# Patient Record
Sex: Female | Born: 1950
Health system: Southern US, Community
[De-identification: ages and names within clinical notes are randomized; demographics above are authoritative.]

## PROBLEM LIST (undated history)

## (undated) DIAGNOSIS — I10 Essential (primary) hypertension: Secondary | ICD-10-CM

## (undated) DIAGNOSIS — Z972 Presence of dental prosthetic device (complete) (partial): Secondary | ICD-10-CM

## (undated) DIAGNOSIS — M25473 Effusion, unspecified ankle: Secondary | ICD-10-CM

## (undated) HISTORY — DX: Effusion, unspecified ankle: M25.473

## (undated) HISTORY — DX: Essential (primary) hypertension: I10

## (undated) HISTORY — PX: ABDOMINAL HYSTERECTOMY: SHX81

## (undated) HISTORY — PX: TUBAL LIGATION: SHX77

---

## 2006-10-14 ENCOUNTER — Other Ambulatory Visit: Payer: Self-pay

## 2006-10-14 ENCOUNTER — Emergency Department: Payer: Self-pay

## 2007-12-29 ENCOUNTER — Emergency Department: Payer: Self-pay

## 2007-12-29 ENCOUNTER — Other Ambulatory Visit: Payer: Self-pay

## 2009-07-23 ENCOUNTER — Emergency Department: Payer: Self-pay | Admitting: Internal Medicine

## 2009-11-14 ENCOUNTER — Ambulatory Visit: Payer: Self-pay

## 2011-03-04 ENCOUNTER — Ambulatory Visit: Payer: Self-pay

## 2011-12-18 ENCOUNTER — Emergency Department: Payer: Self-pay | Admitting: Emergency Medicine

## 2012-05-11 ENCOUNTER — Emergency Department: Payer: Self-pay | Admitting: Emergency Medicine

## 2012-05-11 LAB — CBC
HCT: 40.1 % (ref 35.0–47.0)
HGB: 13.1 g/dL (ref 12.0–16.0)
MCH: 27.1 pg (ref 26.0–34.0)
MCHC: 32.6 g/dL (ref 32.0–36.0)
MCV: 83 fL (ref 80–100)
RBC: 4.82 10*6/uL (ref 3.80–5.20)
WBC: 6.3 10*3/uL (ref 3.6–11.0)

## 2012-05-11 LAB — BASIC METABOLIC PANEL
BUN: 13 mg/dL (ref 7–18)
Calcium, Total: 9.1 mg/dL (ref 8.5–10.1)
Chloride: 105 mmol/L (ref 98–107)
EGFR (Non-African Amer.): >60
Glucose: 80 mg/dL (ref 65–99)
Osmolality: 278 (ref 275–301)
Sodium: 140 mmol/L (ref 136–145)

## 2012-05-12 ENCOUNTER — Ambulatory Visit: Payer: Self-pay

## 2013-08-24 ENCOUNTER — Ambulatory Visit: Payer: Self-pay

## 2013-12-08 ENCOUNTER — Emergency Department: Payer: Self-pay | Admitting: Emergency Medicine

## 2014-11-23 ENCOUNTER — Ambulatory Visit: Payer: Self-pay | Admitting: Family Medicine

## 2014-12-22 ENCOUNTER — Other Ambulatory Visit: Payer: Self-pay | Admitting: Family Medicine

## 2014-12-25 ENCOUNTER — Telehealth: Payer: Self-pay | Admitting: Family Medicine

## 2014-12-25 NOTE — Telephone Encounter (Signed)
PT IS NEEDING REFILL ON MEDS. SHE IS OUT

## 2014-12-26 MED ORDER — LISINOPRIL-HYDROCHLOROTHIAZIDE 20-25 MG PO TABS: 1.0000 | ORAL_TABLET | Freq: Every day | ORAL | Status: DC

## 2014-12-26 NOTE — Telephone Encounter (Signed)
I sent 1 month of medication to pharmacy in pt chart. Pt has not been seen since 05/24/2014 and must make appointment for any further refills.

## 2014-12-26 NOTE — Telephone Encounter (Signed)
Patient informed and appointment made for 01-11-15

## 2015-01-11 ENCOUNTER — Ambulatory Visit: Payer: Self-pay | Admitting: Family Medicine

## 2015-03-01 ENCOUNTER — Other Ambulatory Visit: Payer: Self-pay | Admitting: Family Medicine

## 2015-04-26 ENCOUNTER — Other Ambulatory Visit: Payer: Self-pay | Admitting: Family Medicine

## 2015-05-31 ENCOUNTER — Other Ambulatory Visit: Payer: Self-pay | Admitting: Family Medicine

## 2015-06-07 ENCOUNTER — Telehealth: Payer: Self-pay

## 2015-06-07 ENCOUNTER — Other Ambulatory Visit: Payer: Self-pay

## 2015-06-07 MED ORDER — LISINOPRIL-HYDROCHLOROTHIAZIDE 20-25 MG PO TABS: 1.0000 | ORAL_TABLET | Freq: Every day | ORAL | Status: DC

## 2015-06-07 NOTE — Telephone Encounter (Signed)
This is a [facility] patient and I told them abstract this chart and I scheduled after talking to you but is it only Acute? Let me know if I need to change this please.

## 2015-06-07 NOTE — Telephone Encounter (Signed)
Pt called wanting an appointment and med refills. I offered pt to see someone at [place] and she agreed. Made appt for 06/14/15@1 :30(w/Amy),pt aware. Went over how important it is to keep this appointment due to this office doing a favor for [place] pt verbalized agreement to keep appointment. Also sent refills for pt to pahrmacy.

## 2015-06-14 ENCOUNTER — Encounter: Payer: Self-pay | Admitting: *Deleted

## 2015-06-14 ENCOUNTER — Ambulatory Visit (INDEPENDENT_AMBULATORY_CARE_PROVIDER_SITE_OTHER): Payer: Self-pay | Admitting: Family Medicine

## 2015-06-14 ENCOUNTER — Telehealth: Payer: Self-pay

## 2015-06-14 VITALS — BP 136/69 | HR 66 | Temp 97.6°F | Resp 16 | Ht 62.0 in | Wt 172.0 lb

## 2015-06-14 DIAGNOSIS — I10 Essential (primary) hypertension: Secondary | ICD-10-CM

## 2015-06-14 DIAGNOSIS — R079 Chest pain, unspecified: Secondary | ICD-10-CM

## 2015-06-14 MED ORDER — LISINOPRIL-HYDROCHLOROTHIAZIDE 20-25 MG PO TABS: 1.0000 | ORAL_TABLET | Freq: Every day | ORAL | Status: DC

## 2015-06-14 NOTE — Assessment & Plan Note (Signed)
Renew BP medications. Check CMET. Continue ACE for renal protection. Follow-up with PCP.

## 2015-06-14 NOTE — Progress Notes (Signed)
Subjective:    Patient ID: Brittney Schmidt, female    DOB: 10-Oct-1950, 65 y.o.   MRN: [id number]  HPI: Brittney Schmidt is a 65 y.o. female presenting on 06/14/2015 for Hypertension   HPI   Patient of Dr. Rutherford Nail here for BP follow up. MD is on vacation and out of town until May. Received one refill on lisinopril/HCTZ, but was told she had to see somebody in the interim. Work at [employer] that has loud noises. Has been on same BP medication for years.   Gets dizzy at work and feels like she is going to pass out. Last saw eye doctor 2 years ago.   BP - Does not check at home. [facility], but does not remember what it was CP: related to lifting heavy things, doesn't last very long, does not take medication to help. No SOB. Has nausea with chest tightness sometimes. CP radiates to left arm. Last episode was 2-3 days ago. Has happened about 3 times in the last month. Family history of heart disease in mother.  Rt Ankle pain and swelling - resolved.  Health maintenance: Flu shot - she would like to get today DEXA - never Colonoscopy - never Mammogram - last year, was normal Papsmear - last year, was normal  Past Medical History  Diagnosis Date  . Hypertension   . Ankle swelling   . GERD (gastroesophageal reflux disease)     No current outpatient prescriptions on file prior to visit.   No current facility-administered medications on file prior to visit.    Review of Systems  Constitutional: Negative for fever, activity change and unexpected weight change.  HENT: Negative for hearing loss and trouble swallowing.   Eyes: Negative for pain and visual disturbance.  Respiratory: Positive for chest tightness. Negative for shortness of breath.   Cardiovascular: Positive for chest pain and leg swelling (ankle). Negative for palpitations.  Gastrointestinal: Positive for nausea. Negative for vomiting and abdominal pain.  Musculoskeletal: Positive for joint swelling.  Negative for myalgias, back pain and gait problem.  Skin: Negative for color change.  Allergic/Immunologic: Positive for environmental allergies.  Neurological: Positive for dizziness and headaches. Negative for syncope and light-headedness.  Psychiatric/Behavioral: Negative for behavioral problems and agitation.   Per HPI unless specifically indicated above     Objective:    BP 136/69 mmHg  Pulse 66  Temp(Src) 97.6 F (36.4 C) (Oral)  Resp 16  Ht 5' 2" (1.575 m)  Wt 172 lb (78.019 kg)  BMI 31.45 kg/m2  Wt Readings from Last 3 Encounters:  06/14/15 172 lb (78.019 kg)    Physical Exam  Constitutional: She is oriented to person, place, and time. She appears well-developed.  HENT:  Head: Normocephalic.  Eyes: Conjunctivae and EOM are normal. Pupils are equal, round, and reactive to light.  Neck: Normal range of motion. Carotid bruit is not present. No thyromegaly present.  Cardiovascular: Normal rate, regular rhythm, normal heart sounds and intact distal pulses.   Pulses:      Radial pulses are 2+ on the right side, and 2+ on the left side.       Dorsalis pedis pulses are 2+ on the right side, and 2+ on the left side.  Pulmonary/Chest: Effort normal and breath sounds normal. No respiratory distress.  Abdominal: Soft. Bowel sounds are normal. She exhibits no mass. There is no splenomegaly or hepatomegaly. There is no tenderness. There is no rebound and no guarding.  Musculoskeletal: Normal range  of motion. Edema: right ankle.  Neurological: She is alert and oriented to person, place, and time. She has normal strength. She displays normal reflexes. No cranial nerve deficit or sensory deficit. She displays a negative Romberg sign. Coordination normal. GCS eye subscore is 4. GCS verbal subscore is 5. GCS motor subscore is 6.  Skin: Skin is warm and dry. No erythema.  Psychiatric: She has a normal mood and affect. Her behavior is normal.   Results for orders placed or performed in  visit on 05/11/12  CBC  Result Value Ref Range   WBC 6.3 3.6-11.0 x10 3/mm 3   RBC 4.82 3.80-5.20 X10 6/mm 3   HGB 13.1 12.0-16.0 g/dL   HCT 40.1 35.0-47.0 %   MCV 83 80-100 fL   MCH 27.1 26.0-34.0 pg   MCHC 32.6 32.0-36.0 g/dL   RDW 13.3 11.5-14.5 %   Platelet 216 150-440 x10 3/mm 3  Basic metabolic panel  Result Value Ref Range   Glucose 80 65-99 mg/dL   BUN 13 7-18 mg/dL   Creatinine 0.84 0.60-1.30 mg/dL   Sodium 140 136-145 mmol/L   Potassium 3.4 (L) 3.5-5.1 mmol/L   Chloride 105 98-107 mmol/L   Co2 30 21-32 mmol/L   Calcium, Total 9.1 8.5-10.1 mg/dL   Osmolality 278 275-301   Anion Gap 5 (L) 7-16   EGFR (African American) >60    EGFR (Non-African Amer.) >60   Troponin I  Result Value Ref Range   Troponin-I < 0.02 ng/mL  Troponin I  Result Value Ref Range   Troponin-I < 0.02 ng/mL      Assessment & Plan:     Problem List Items Addressed This Visit      Cardiovascular and Mediastinum   Hypertension   Relevant Medications   aspirin 81 MG tablet   lisinopril-hydrochlorothiazide (PRINZIDE,ZESTORETIC) 20-25 MG tablet   Other Relevant Orders   Comprehensive metabolic panel    Other Visit Diagnoses    Chest pain on exertion    -  Primary    [place] MSK since it has only occurred with lifting. EKG unchanged from previous. however she may need cardiology consult given history. Have called PCP to ensure patient follows on chest pain within 2 weeks. so they can initiate a referral. Alarm symptoms reviewed. Pt to ER with further CP.     Relevant Orders    Lipid Profile    CBC with Differential/Platelet       Meds ordered this encounter  Medications  . aspirin 81 MG tablet    Sig: Take 81 mg by mouth daily.  Marland Kitchen ibuprofen (ADVIL,MOTRIN) 200 MG tablet    Sig: Take 200 mg by mouth every 6 (six) hours as needed.  . diphenhydrAMINE (ALLERGY) 25 mg capsule    Sig: Take 25 mg by mouth every 6 (six) hours as needed.  Marland Kitchen lisinopril-hydrochlorothiazide (PRINZIDE,ZESTORETIC)  20-25 MG tablet    Sig: Take 1 tablet by mouth daily.    Dispense:  30 tablet    Refill:  5    Order Specific Question:  Supervising Provider    Answer:  Arlis Porta [[id number]]      Follow up plan: Return in about 2 weeks (around 06/28/2015) for chest pain with [facility].Marland Kitchen

## 2015-06-14 NOTE — Patient Instructions (Signed)
We will talk with [facility] about your chest pain. I think it might be muscle related however, you can never be too careful.  Your doctors over there may want you to see cardiology.   IF you have continued chest pain, shortness of breath, nausea, palpitations or any concerning symptoms, please go directly to the ER.   Your goal blood pressure is 140/90. Work on low salt/sodium diet - goal <1.5gm (1,500mg ) per day. Eat a diet high in fruits/vegetables and whole grains.  Look into mediterranean and DASH diet. Goal activity is 133min/wk of moderate intensity exercise.  This can be split into 30 minute chunks.  If you are not at this level, you can start with smaller 10-15 min increments and slowly build up activity. Look at [email or web address]org for more resources.

## 2015-06-14 NOTE — Addendum Note (Signed)
Addended byVincenza Hews, Ariella Voit L on: 06/14/2015 05:28 PM   Modules accepted: Orders, SmartSet

## 2015-06-14 NOTE — Telephone Encounter (Signed)
NP called from [facility], they saw her today for f/u on HTN and she also noted she had chest pain.  They did EKG and everything was stable.  They want to make sure she followed up with Dr. Rutherford Nail.

## 2016-06-04 ENCOUNTER — Encounter: Payer: Self-pay | Admitting: Family Medicine

## 2016-06-04 ENCOUNTER — Ambulatory Visit (INDEPENDENT_AMBULATORY_CARE_PROVIDER_SITE_OTHER): Payer: Self-pay | Admitting: Family Medicine

## 2016-06-04 VITALS — BP 134/72 | HR 87 | Temp 97.6°F | Resp 16 | Ht 62.0 in | Wt 173.0 lb

## 2016-06-04 DIAGNOSIS — E669 Obesity, unspecified: Secondary | ICD-10-CM

## 2016-06-04 DIAGNOSIS — I10 Essential (primary) hypertension: Secondary | ICD-10-CM

## 2016-06-04 DIAGNOSIS — Z7689 Persons encountering health services in other specified circumstances: Secondary | ICD-10-CM

## 2016-06-04 MED ORDER — LISINOPRIL-HYDROCHLOROTHIAZIDE 20-25 MG PO TABS: 1.0000 | ORAL_TABLET | Freq: Every day | ORAL | 3 refills | Status: DC

## 2016-06-04 NOTE — Progress Notes (Signed)
Subjective:    Patient ID: Brittney Schmidt, female    DOB: 05/23/1950, 66 y.o.   MRN: [id number]  Brittney Schmidt is a 66 y.o. female presenting on 06/04/2016 for Establish Care and Hypertension  Last visit 05/2015 with Amy Krebs at our office, patient never returned to see Dr Rutherford Nail before they retired and now she is establishing as new patient here.  HPI   CHRONIC HTN: Reports prior diagnosis >10 years ago Current Meds - Lisinopril-HCTZ 20-24m daily (has not run out yet, has 2 pills) Reports good compliance, took meds today. Tolerating well, w/o complaints. - Taking OTC ASA 861m- No prior history of MI / CVA. Notable family history of heart disease MI Denies CP, dyspnea, HA, edema, dizziness / lightheadedness  Obesity BMI >31 - Reports no known history of hyperlipidemia, abnormal glucose or pre-diabetes/diabetes. - Admits eats a lot of sweets, will try to reduce this in future, does eat plenty of fruit and some vegetables, eats out often, frequent fast food. Does eat potatoes as frequent carb, has not reduced portion size or other dietary measures - Drinks mostly water, no sweet tea or soda. Does consume alcohol mostly beer 1-2 times a week  Additional social history: - Patient has no insurance currently, states she is self pay  Health Maintenance: [facility] normal and had prior pap smear screening in 2015 normal. Would like to resume [facility] screening  Past Medical History:  Diagnosis Date  . Ankle swelling   . GERD (gastroesophageal reflux disease)   . Hypertension    Past Surgical History:  Procedure Laterality Date  . TUBAL LIGATION     Social History   Social History  . Marital status: Single    Spouse name: N/A  . Number of children: 3  . Years of education: GED   Occupational History  . unemployed (previously worked as temp)    Social History Main Topics  . Smoking status: Never  Smoker  . Smokeless tobacco: Never Used  . Alcohol use 1.2 oz/week    2 Cans of beer per week     Comment: occasional, planning to quit completely  . Drug use: No  . Sexual activity: Not Currently    Partners: Male   Other Topics Concern  . Not on file   Social History Narrative  . No narrative on file   Family History  Problem Relation Age of Onset  . Diabetes Mother   . Hypertension Mother   . Colon polyps Father   . Heart disease Father   . Heart attack Father   . Cancer Brother   . Hypertension Son    Current Outpatient Prescriptions on File Prior to Visit  Medication Sig  . aspirin 81 MG tablet Take 81 mg by mouth daily.  . Marland Kitchenbuprofen (ADVIL,MOTRIN) 200 MG tablet Take 200 mg by mouth every 6 (six) hours as needed.   No current facility-administered medications on file prior to visit.     Review of Systems Per HPI unless specifically indicated above     Objective:    BP 134/72 (BP Location: Left Arm, Cuff Size: Normal)   Pulse 87   Temp 97.6 F (36.4 C) (Oral)   Resp 16   Ht _0  (1.575 m)   Wt 173 lb (78.5 kg)   BMI 31.64 kg/m   Wt Readings from Last 3 Encounters:  06/04/16 173 lb (78.5 kg)  06/14/15 172  lb (78 kg)    Physical Exam  Constitutional: She is oriented to person, place, and time. She appears well-developed and well-nourished. No distress.  Well-appearing, comfortable, cooperative  HENT:  Head: Normocephalic and atraumatic.  Mouth/Throat: Oropharynx is clear and moist.  Frontal / maxillary sinuses non-tender. Nares patent without purulence or edema. Bilateral TMs clear without erythema, effusion or bulging. Oropharynx clear without erythema, exudates, edema or asymmetry.  Eyes: Conjunctivae and EOM are normal. Pupils are equal, round, and reactive to light.  Neck: Normal range of motion. Neck supple. No thyromegaly present.  No carotid bruits  Cardiovascular: Normal rate, regular rhythm, normal heart sounds and intact distal pulses.   No  murmur heard. Pulmonary/Chest: Effort normal and breath sounds normal. No respiratory distress. She has no wheezes. She has no rales.  Abdominal: Soft. Bowel sounds are normal.  Musculoskeletal: Normal range of motion. She exhibits no edema or tenderness.  Upper / Lower Extremities: - Normal muscle tone, strength bilateral upper extremities 5/5, lower extremities 5/5  - Normal Gait  Lymphadenopathy:    She has no cervical adenopathy.  Neurological: She is alert and oriented to person, place, and time.  Skin: Skin is warm and dry. No rash noted. She is not diaphoretic.  Psychiatric: She has a normal mood and affect. Her behavior is normal.  Well groomed, good eye contact, normal speech and thoughts  Nursing note and vitals reviewed.  I have personally reviewed the following lab results from 05/11/12.  Results for orders placed or performed in visit on 05/11/12  CBC  Result Value Ref Range   WBC 6.3 3.6 - 11.0 x10 3/mm 3   RBC 4.82 3.80 - 5.20 X10 6/mm 3   HGB 13.1 12.0 - 16.0 g/dL   HCT 40.1 35.0 - 47.0 %   MCV 83 80 - 100 fL   MCH 27.1 26.0 - 34.0 pg   MCHC 32.6 32.0 - 36.0 g/dL   RDW 13.3 11.5 - 14.5 %   Platelet 216 150 - 440 x10 3/mm 3  Basic metabolic panel  Result Value Ref Range   Glucose 80 65 - 99 mg/dL   BUN 13 7 - 18 mg/dL   Creatinine 0.84 0.60 - 1.30 mg/dL   Sodium 140 136 - 145 mmol/L   Potassium 3.4 (L) 3.5 - 5.1 mmol/L   Chloride 105 98 - 107 mmol/L   Co2 30 21 - 32 mmol/L   Calcium, Total 9.1 8.5 - 10.1 mg/dL   Osmolality 278 275 - 301   Anion Gap 5 (L) 7 - 16   EGFR (African American) >60    EGFR (Non-African Amer.) >60   Troponin I  Result Value Ref Range   Troponin-I < 0.02 ng/mL  Troponin I  Result Value Ref Range   Troponin-I < 0.02 ng/mL      Assessment & Plan:   Problem List Items Addressed This Visit    Obesity (BMI 30.0-34.9)    Gradual weight gain, recent limited lifestyle modifications, previously had done better. - Concern with  family history HTN, DM, MI/CAD  Plan: 1. Encourage lifestyle mods for weight management - increase regular exercise walking, lower portion size, reduce carbs, low sodium 2. Handouts given on diet / exercise 3. Check future fasting labs lipids, A1c, CMET - when able, prefer yearly 4. Follow-up 1 year or sooner as needed      Relevant Orders   Lipid panel   Hypertension    Mild elevated initial BP, improved on  repeat manual check No known complications, limited lab testing due to no insurance / financial, last labs 2014   Plan:  1. Continue current med today - refilled Lisinopril-HCTZ 20-22m daily, given 90 day supply +3 refills 2. Future lab CMET ordered, lipids, A1c - patient to get labs when able, prefer yearly, understand if unable to afford 3. Lifestyle Mods - Improve regular walking exercise with warmer weather, limit sodium in food, healthy options 4. Monitor BP at home or at drug store occasionally, handout given for BP log, bring to future visit (has daughter in medical training, maybe can check her BP) 5. Follow-up 1 year for annual physical, sooner if BP elevated      Relevant Medications   lisinopril-hydrochlorothiazide (PRINZIDE,ZESTORETIC) 20-25 MG tablet   Other Relevant Orders   COMPLETE METABOLIC PANEL WITH GFR   Lipid panel   Hemoglobin A1c    Other Visit Diagnoses    Encounter to establish care with new doctor    -  Primary      SCREENING: - Handout given contact # for BCCCP breast/cervical CA screening through [facility] for uninsured patients   Meds ordered this encounter  Medications  . lisinopril-hydrochlorothiazide (PRINZIDE,ZESTORETIC) 20-25 MG tablet    Sig: Take 1 tablet by mouth daily.    Dispense:  90 tablet    Refill:  3    Follow up plan: Return in about 1 year (around 06/04/2017) for Annual physical.  ANobie Putnam DO [facility] 06/04/2016, 6:56 PM

## 2016-06-04 NOTE — Patient Instructions (Addendum)
Thank you for coming in to clinic today.  1. Keep up the good work  Refilled Blood pressure med, 90 day supply at pharmacy  - Now work on reducing alcohol with less beer, this has carbs and can make you gain weight as well, also alcohol will raise your blood pressure  - Work on resuming walking as planned  The 5 Minute Rule of Exercise - Promise yourself to at least do 5 minutes of exercise (make sure you time it), and if at the end of 5 minutes (this is the hardest part of the work-out), if you still feel like you want stop (or not motivated to continue) then allow yourself to stop. Otherwise, more often than not you will feel encouraged that you can continue for a little while longer or even more!  Diet Recommendations for LOW CARB   LIMIT Starchy (carb) foods include: Bread, rice, pasta, potatoes, corn, crackers, bagels, muffins, all baked goods.   GOOD Protein foods include: Meat, fish, poultry, eggs, dairy foods, and beans such as pinto and kidney beans (beans also provide carbohydrate).   1. Eat at least 3 meals and 1-2 snacks per day. Never go more than 4-5 hours while awake without eating.   2. Limit starchy foods to TWO per meal and ONE per snack. ONE portion of a starchy  food is equal to the following:   - ONE slice of bread (or its equivalent, such as half of a hamburger bun).   - 1/2 cup of a "scoopable" starchy food such as potatoes or rice.   - 1 OUNCE (28 grams) of starchy snacks (crackers or pretzels, look on label).   - 15 grams of carbohydrate as shown on food label.   3. Both lunch and dinner should include a protein food, a carb food, and vegetables.   - Obtain twice as many veg's as protein or carbohydrate foods for both lunch and dinner.   - Try to keep frozen veg's on hand for a quick vegetable serving.     - Fresh or frozen veg's are best.   4. Breakfast should always include protein.    -------------------------- Check with hospital [facility] program for Breast  Cancer / Cervical Cancer Prevention  Breast and Cervical Cancer Control Program (BCCCP): Women who are uninsured or underinsured may be eligible to receive a free mammogram and pap smear through the Breast and Cervical Cancer Control Program (BCCCP). For more information about eligibility for this program, call [phone].  ----------------  You will be due for FASTING BLOOD WORK (no food or drink after midnight before, only water or coffee without cream/sugar on the morning of)  - WHEN READY anytime in next 6-9 months in 2018, CALL us TO SCHEDULE "Lab Only" visit in the morning at the clinic  Please schedule a follow-up appointment with Dr. Parks Ranger in 6-12 months for BP vs Annual Physical if wait until 1 year  If you have any other questions or concerns, please feel free to call the clinic or send a message through [place]. You may also schedule an earlier appointment if necessary.  Nobie Putnam, DO [place]

## 2016-06-04 NOTE — Assessment & Plan Note (Signed)
Gradual weight gain, recent limited lifestyle modifications, previously had done better. - Concern with family history HTN, DM, MI/CAD  Plan: 1. Encourage lifestyle mods for weight management - increase regular exercise walking, lower portion size, reduce carbs, low sodium 2. Handouts given on diet / exercise 3. Check future fasting labs lipids, A1c, CMET - when able, prefer yearly 4. Follow-up 1 year or sooner as needed

## 2016-06-04 NOTE — Assessment & Plan Note (Signed)
Mild elevated initial BP, improved on repeat manual check No known complications, limited lab testing due to no insurance / financial, last labs 2014   Plan:  1. Continue current med today - refilled Lisinopril-HCTZ 20-25mg  daily, given 90 day supply +3 refills 2. Future lab CMET ordered, lipids, A1c - patient to get labs when able, prefer yearly, understand if unable to afford 3. Lifestyle Mods - Improve regular walking exercise with warmer weather, limit sodium in food, healthy options 4. Monitor BP at home or at drug store occasionally, handout given for BP log, bring to future visit (has daughter in medical training, maybe can check her BP) 5. Follow-up 1 year for annual physical, sooner if BP elevated

## 2016-07-30 ENCOUNTER — Ambulatory Visit: Payer: Self-pay | Attending: Oncology | Admitting: *Deleted

## 2016-07-30 ENCOUNTER — Ambulatory Visit
Admission: RE | Admit: 2016-07-30 | Discharge: 2016-07-30 | Disposition: A | Discharge status: Home / Self Care | Payer: Self-pay | Source: Ambulatory Visit | Attending: Oncology | Admitting: Oncology

## 2016-07-30 VITALS — BP 136/86 | HR 79 | Temp 98.3°F | Resp 18

## 2016-07-30 DIAGNOSIS — Z0000 Encounter for general adult medical examination without abnormal findings: Secondary | ICD-10-CM

## 2016-07-30 NOTE — Patient Instructions (Signed)
HPV Test The human papillomavirus (HPV) test is used to look for high-risk types of HPV infection. HPV is a group of about 100 viruses. Many of these viruses cause growths on, in, or around the genitals. Most HPV viruses cause infections that usually go away without treatment. However, HPV types 6, 11, 16, and 18 are considered high-risk types of HPV that can increase your risk of cancer of the cervix or anus if the infection is left untreated. An HPV test identifies the DNA (genetic) strands of the HPV infection, so it is also referred to as the HPV DNA test. Although HPV is found in both males and females, the HPV test is only used to screen for increased cancer risk in females:  With an abnormal Pap test.  After treatment of an abnormal Pap test.  Between the ages of 30 and 65.  After treatment of a high-risk HPV infection. The HPV test may be done at the same time as a pelvic exam and Pap test in females over the age of 30. Both the HPV test and Pap test require a sample of cells from the cervix. How do I prepare for this test?  Do not douche or take a bath for 24-48 hours before the test or as directed by your health care provider.  Do not have sex for 24-48 hours before the test or as directed by your health care provider.  You may be asked to reschedule the test if you are menstruating.  You will be asked to urinate before the test. What do the results mean? It is your responsibility to obtain your test results. Ask the lab or department performing the test when and how you will get your results. Talk with your health care provider if you have any questions about your results. Your result will be negative or positive. Meaning of Negative Test Results  A negative HPV test result means that no HPV was found, and it is very likely that you do not have HPV. Meaning of Positive Test Results  A positive HPV test result indicates that you have HPV.  If your test result shows the presence  of any high-risk HPV strains, you may have an increased risk of developing cancer of the cervix or anus if the infection is left untreated.  If any low-risk HPV strains are found, you are not likely to have an increased risk of cancer. Discuss your test results with your health care provider. He or she will use the results to make a diagnosis and determine a treatment plan that is right for you. Talk with your health care provider to discuss your results, treatment options, and if necessary, the need for more tests. Talk with your health care provider if you have any questions about your results. This information is not intended to replace advice given to you by your health care provider. Make sure you discuss any questions you have with your health care provider. Document Released: 04/04/2004 Document Revised: 11/14/2015 Document Reviewed: 07/26/2013 Elsevier Interactive Patient Education  2017 [employer] Inc.  Gave patient hand-out, Women Staying Healthy, Active and Well from BCCCP, with education on breast health, pap smears, heart and colon health.  

## 2016-07-30 NOTE — Progress Notes (Signed)
Subjective:     Patient ID: [name], female   DOB: 06-Oct-1950, 66 y.o.   MRN: [id number]  HPI   Review of Systems     Objective:   Physical Exam  Pulmonary/Chest: Right breast exhibits no inverted nipple, no mass, no nipple discharge, no skin change and no tenderness. Left breast exhibits no inverted nipple, no mass, no nipple discharge, no skin change and no tenderness. Breasts are symmetrical.    Large pendulous breast  Abdominal: There is no splenomegaly or hepatomegaly.  Genitourinary: No labial fusion. There is no rash, tenderness, lesion or injury on the right labia. There is no rash, tenderness, lesion or injury on the left labia. Cervix exhibits no motion tenderness, no discharge and no friability. Right adnexum displays no mass, no tenderness and no fullness. Left adnexum displays no mass, no tenderness and no fullness. No erythema, tenderness or bleeding in the vagina. No foreign body in the vagina. No signs of injury around the vagina. No vaginal discharge found.       Assessment:     66 year old Black female returns to [facility] for annual screening.  Clinical breast exam unremarkable.  Taught self breast awareness.  Specimen collected for pap smear.  Patient has been screened for eligibility.  She does not have any insurance, Medicare or Medicaid.  She also meets financial eligibility.  Hand-out given on the Affordable Care Act.    Plan:     Screening mammogram ordered.  Specimen for pap sent to the lab.  Will follow-up per [facility] protocol.

## 2016-08-04 ENCOUNTER — Encounter: Payer: Self-pay | Admitting: *Deleted

## 2016-08-04 LAB — PAP LB AND HPV HIGH-RISK
HPV, high-risk: NEGATIVE
PAP Smear Comment: 0

## 2016-08-04 NOTE — Progress Notes (Signed)
Letter mailed to inform patient of her normal mammogram and pap results.  She is to follow up in one year with annual screening mammogram.  Next pap will be due in 5 years.  HSIS to [place].

## 2017-04-03 ENCOUNTER — Other Ambulatory Visit: Payer: Self-pay

## 2017-04-03 ENCOUNTER — Encounter: Payer: Self-pay | Admitting: Emergency Medicine

## 2017-04-03 ENCOUNTER — Emergency Department
Admission: EM | Admit: 2017-04-03 | Discharge: 2017-04-03 | Disposition: A | Discharge status: Home / Self Care | Payer: No Typology Code available for payment source | Attending: Emergency Medicine | Admitting: Emergency Medicine

## 2017-04-03 ENCOUNTER — Emergency Department: Payer: No Typology Code available for payment source

## 2017-04-03 DIAGNOSIS — Z79899 Other long term (current) drug therapy: Secondary | ICD-10-CM | POA: Diagnosis not present

## 2017-04-03 DIAGNOSIS — Y999 Unspecified external cause status: Secondary | ICD-10-CM | POA: Diagnosis not present

## 2017-04-03 DIAGNOSIS — I10 Essential (primary) hypertension: Secondary | ICD-10-CM | POA: Insufficient documentation

## 2017-04-03 DIAGNOSIS — Z7982 Long term (current) use of aspirin: Secondary | ICD-10-CM | POA: Diagnosis not present

## 2017-04-03 DIAGNOSIS — S199XXA Unspecified injury of neck, initial encounter: Secondary | ICD-10-CM | POA: Diagnosis present

## 2017-04-03 DIAGNOSIS — Y929 Unspecified place or not applicable: Secondary | ICD-10-CM | POA: Insufficient documentation

## 2017-04-03 DIAGNOSIS — S161XXA Strain of muscle, fascia and tendon at neck level, initial encounter: Secondary | ICD-10-CM

## 2017-04-03 DIAGNOSIS — Y939 Activity, unspecified: Secondary | ICD-10-CM | POA: Insufficient documentation

## 2017-04-03 MED ORDER — METHOCARBAMOL 500 MG PO TABS: 500.0000 mg | ORAL_TABLET | Freq: Three times a day (TID) | ORAL | 0 refills | Status: DC

## 2017-04-03 MED ORDER — MELOXICAM 15 MG PO TABS: 15.0000 mg | ORAL_TABLET | Freq: Every day | ORAL | 2 refills | Status: DC
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
## 2017-04-03 NOTE — Discharge Instructions (Signed)
Follow-up with your regular doctor if you are not better in 5-7 days, or you can see orthopedics phone number is listed above, use the medication as prescribed, use ice to any areas that hurt, and 3 days she can use wet heat followed by ice, do not sit on a dry heating pad as that will make your muscles brittle and make the pain worse, if you are worsening please return the emergency department
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
## 2017-04-06 ENCOUNTER — Ambulatory Visit (INDEPENDENT_AMBULATORY_CARE_PROVIDER_SITE_OTHER): Payer: Self-pay | Admitting: Family Medicine

## 2017-04-06 ENCOUNTER — Encounter: Payer: Self-pay | Admitting: Family Medicine

## 2017-04-06 VITALS — BP 136/67 | HR 73 | Temp 98.3°F | Resp 16 | Ht 62.0 in | Wt 170.0 lb

## 2017-04-06 DIAGNOSIS — S134XXA Sprain of ligaments of cervical spine, initial encounter: Secondary | ICD-10-CM

## 2017-04-06 DIAGNOSIS — M542 Cervicalgia: Secondary | ICD-10-CM

## 2017-04-06 NOTE — Patient Instructions (Signed)
Thank you for coming to the office today.  1. For your Neck pain - Overall it seems reassuring. I don't see any findings on exam that are concerning for serious head injury or neurological injury.  Neck X-ray was negative in ED, no fracture.  2. Continue Robaxin muscle relaxant as needed - if makes you too sleepy or run out of it, then we can switch medicine to Baclofen if need  3. Continue Meloxicam 15mg  daily for now up to 4-6 weeks then use only as needed  4. Increase Tylenol to 1000mg  up to 3 times daily for breakthrough pain for 3-5 days then only as needed  5. Use moist heat or heating pad on shoulders / neck, and have family member help with soft tissue massage as demonstrated  Please schedule a follow-up appointment in 2 to 4 weeks if symptoms not improving, or sooner if worsening  If develop severe headaches, nausea / vomiting, loss of vision, numbness, weakness or tingling - call 911 or go immediately to ED.  Please schedule a Follow-up Appointment to: Return in about 6 months (around 10/04/2017) for Welcome to Medicare visit.  If you have any other questions or concerns, please feel free to call the office or send a message through [place]. You may also schedule an earlier appointment if necessary.  Additionally, you may be receiving a survey about your experience at our office within a few days to 1 week by e-mail or mail. We value your feedback.  Nobie Putnam, DO [place]
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
## 2017-08-10 ENCOUNTER — Ambulatory Visit
Admission: RE | Admit: 2017-08-10 | Discharge: 2017-08-10 | Disposition: A | Discharge status: Home / Self Care | Payer: Self-pay | Source: Ambulatory Visit | Attending: Oncology | Admitting: Oncology

## 2017-08-10 ENCOUNTER — Ambulatory Visit: Payer: Self-pay | Attending: Oncology | Admitting: *Deleted

## 2017-08-10 ENCOUNTER — Encounter (INDEPENDENT_AMBULATORY_CARE_PROVIDER_SITE_OTHER): Payer: Self-pay

## 2017-08-10 VITALS — BP 102/69 | HR 73 | Temp 98.4°F | Ht 62.0 in | Wt 166.0 lb

## 2017-08-10 DIAGNOSIS — Z0000 Encounter for general adult medical examination without abnormal findings: Secondary | ICD-10-CM

## 2017-08-10 NOTE — Patient Instructions (Signed)
Gave patient hand-out, Women Staying Healthy, Active and Well from [facility], with education on breast health, pap smears, heart and colon health. 

## 2017-08-10 NOTE — Progress Notes (Signed)
  Subjective:     Patient ID: Brittney Schmidt, female   DOB: [birth year], 67 y.o.   MRN: [id number]  HPI   Review of Systems     Objective:   Physical Exam  Pulmonary/Chest: Right breast exhibits no inverted nipple, no mass, no nipple discharge, no skin change and no tenderness. Left breast exhibits no inverted nipple, no mass, no nipple discharge, no skin change and no tenderness.  Large pendulous breast       Assessment:     67 year old Black female returns to [facility] for annual screening.  Clinical breast exam unremarkable.  Taught self breast awareness.  Last pap on 07/30/16 was negative / negative.  Next pap due in 2023.  Patient has been screened for eligibility.  She does not have any insurance, Medicare or Medicaid.  She also meets financial eligibility.  Hand-out given on the Affordable Care Act.    Plan:     Screening mammogram ordered.  Will follow-up per [facility] protocol.

## 2017-08-11 ENCOUNTER — Encounter: Payer: Self-pay | Admitting: *Deleted

## 2017-08-11 NOTE — Progress Notes (Signed)
Letter mailed from the [facility] to inform patient of her normal mammogram results.  Patient is to follow-up with annual screening in one year.  HSIS to [place]. 

## 2017-09-10 ENCOUNTER — Other Ambulatory Visit: Payer: Self-pay | Admitting: Family Medicine

## 2017-09-10 DIAGNOSIS — I10 Essential (primary) hypertension: Secondary | ICD-10-CM

## 2017-09-15 ENCOUNTER — Emergency Department: Payer: Self-pay

## 2017-09-15 ENCOUNTER — Other Ambulatory Visit: Payer: Self-pay

## 2017-09-15 ENCOUNTER — Emergency Department
Admission: EM | Admit: 2017-09-15 | Discharge: 2017-09-15 | Disposition: A | Discharge status: Home / Self Care | Payer: Self-pay | Attending: Emergency Medicine | Admitting: Emergency Medicine

## 2017-09-15 ENCOUNTER — Encounter: Payer: Self-pay | Admitting: Emergency Medicine

## 2017-09-15 DIAGNOSIS — R079 Chest pain, unspecified: Secondary | ICD-10-CM

## 2017-09-15 DIAGNOSIS — Z7982 Long term (current) use of aspirin: Secondary | ICD-10-CM | POA: Insufficient documentation

## 2017-09-15 DIAGNOSIS — Z79899 Other long term (current) drug therapy: Secondary | ICD-10-CM | POA: Insufficient documentation

## 2017-09-15 DIAGNOSIS — I10 Essential (primary) hypertension: Secondary | ICD-10-CM | POA: Insufficient documentation

## 2017-09-15 LAB — BASIC METABOLIC PANEL
Anion gap: 8 (ref 5–15)
BUN: 10 mg/dL (ref 8–23)
CHLORIDE: 101 mmol/L (ref 98–111)
CO2: 30 mmol/L (ref 22–32)
CREATININE: 0.68 mg/dL (ref 0.44–1.00)
Calcium: 9.6 mg/dL (ref 8.9–10.3)
GFR calc non Af Amer: >60 mL/min (ref >60)
GLUCOSE: 108 mg/dL — AB (ref 70–99)
Potassium: 3.5 mmol/L (ref 3.5–5.1)
Sodium: 139 mmol/L (ref 135–145)

## 2017-09-15 LAB — TROPONIN I
Troponin I: <0.03 ng/mL (ref <0.03)
Troponin I: <0.03 ng/mL (ref <0.03)

## 2017-09-15 LAB — CBC
HCT: 43.9 % (ref 35.0–47.0)
HEMOGLOBIN: 14.3 g/dL (ref 12.0–16.0)
MCH: 27.1 pg (ref 26.0–34.0)
MCHC: 32.4 g/dL (ref 32.0–36.0)
MCV: 83.5 fL (ref 80.0–100.0)
PLATELETS: 234 10*3/uL (ref 150–440)
RBC: 5.26 MIL/uL — AB (ref 3.80–5.20)
RDW: 14.1 % (ref 11.5–14.5)
WBC: 6.9 10*3/uL (ref 3.6–11.0)

## 2017-09-15 MED ORDER — OMEPRAZOLE 40 MG PO CPDR: 40.0000 mg | DELAYED_RELEASE_CAPSULE | Freq: Every day | ORAL | 0 refills | Status: DC

## 2017-09-15 MED ORDER — GI COCKTAIL ~~LOC~~
30.0000 mL | Freq: Once | ORAL | Status: AC
  Administered 2017-09-15: 30 mL via ORAL
  Filled 2017-09-15: qty 30

## 2017-09-15 NOTE — ED Triage Notes (Signed)
Patient ambulatory to triage with steady gait, without difficulty or distress noted; pt reports "indigestion", upper CP with no accomp symptoms; denies hx of same

## 2017-09-15 NOTE — Discharge Instructions (Addendum)
PLease follow up with your primary care physician °

## 2017-09-15 NOTE — ED Provider Notes (Signed)
[facility] Emergency Department Provider Note   ____________________________________________   First MD Initiated Contact with Patient 09/15/17 [phone]     (approximate)  I have reviewed the triage vital signs and the nursing notes.   HISTORY  Chief Complaint Chest Pain    HPI Brittney Schmidt is a 67 y.o. female who comes into the hospital today with some chest pain.  The patient reports that she thought it was indigestion but it kept ongoing on and off.  She reports it would come and go and come and go.  The patient ate a spoonful of mustard.  She had eaten [employer] and some grapes before the symptoms started.  The patient denies any shortness of breath and states that the pain radiated to her right shoulder.  The pain is gone at this time but the patient was concerned which is what brought her in.  She states that she did have some dizziness but no sweats.  The patient is here today for evaluation.  She did not take any medicines at home for the pain.   Past Medical History:  Diagnosis Date  . Ankle swelling   . GERD (gastroesophageal reflux disease)   . Hypertension     Patient Active Problem List   Diagnosis Date Noted  . Obesity (BMI 30.0-34.9) 06/04/2016  . Hypertension 06/14/2015    Past Surgical History:  Procedure Laterality Date  . TUBAL LIGATION      Prior to Admission medications   Medication Sig Start Date End Date Taking? Authorizing Provider  aspirin 81 MG tablet Take 81 mg by mouth daily.    [provider]  ibuprofen (ADVIL,MOTRIN) 200 MG tablet Take 200 mg by mouth every 6 (six) hours as needed.    [provider]  lisinopril-hydrochlorothiazide (PRINZIDE,ZESTORETIC) 20-25 MG tablet TAKE 1 TABLET BY MOUTH ONCE DAILY 09/10/17   Parks Ranger, Devonne Doughty, DO  meloxicam (MOBIC) 15 MG tablet Take 1 tablet (15 mg total) by mouth daily. 04/03/17 04/03/18  Fisher, Linden Dolin, PA-C  methocarbamol (ROBAXIN) 500 MG tablet Take  1 tablet (500 mg total) by mouth 3 (three) times daily. 04/03/17   Fisher, Linden Dolin, PA-C  omeprazole (PRILOSEC) 40 MG capsule Take 1 capsule (40 mg total) by mouth daily. 09/15/17 09/15/18  Loney Hering, MD    Allergies Patient has no known allergies.  Family History  Problem Relation Age of Onset  . Diabetes Mother   . Hypertension Mother   . Colon polyps Father   . Heart disease Father   . Heart attack Father   . Cancer Brother   . Hypertension Son   . Breast cancer Neg Hx     Social History Social History   Tobacco Use  . Smoking status: Never Smoker  . Smokeless tobacco: Never Used  Substance Use Topics  . Alcohol use: Yes    Alcohol/week: 1.2 oz    Types: 2 Cans of beer per week    Comment: occasional, planning to quit completely  . Drug use: No    Review of Systems  Constitutional: No fever/chills Eyes: No visual changes. ENT: No sore throat. Cardiovascular:  chest pain. Respiratory: Denies shortness of breath. Gastrointestinal: Nausea with abdominal pain.  no vomiting.  No diarrhea.  No constipation. Genitourinary: Negative for dysuria. Musculoskeletal: Negative for back pain. Skin: Negative for rash. Neurological: Dizziness   ____________________________________________   PHYSICAL EXAM:  VITAL SIGNS: ED Triage Vitals  Enc Vitals Group     BP  09/15/17 0104 (!) 153/78     Pulse Rate 09/15/17 0104 81     Resp 09/15/17 0104 19     Temp 09/15/17 0104 98 F (36.7 C)     Temp Source 09/15/17 0104 Oral     SpO2 09/15/17 0104 98 %     Weight 09/15/17 0054 165 lb (74.8 kg)     Height 09/15/17 0054 5\' 2"  (1.575 m)     Head Circumference --      Peak Flow --      Pain Score 09/15/17 0054 2     Pain Loc --      Pain Edu? --      Excl. in [place]? --     Constitutional: Alert and oriented. Well appearing and in mild distress. Eyes: Conjunctivae are normal. PERRL. EOMI. Head: Atraumatic. Nose: No congestion/rhinnorhea. Mouth/Throat: Mucous membranes  are moist.  Oropharynx non-erythematous. Cardiovascular: Normal rate, regular rhythm. Grossly normal heart sounds.  Good peripheral circulation. Respiratory: Normal respiratory effort.  No retractions. Lungs CTAB. Gastrointestinal: Soft and nontender. No distention.  Positive bowel sounds Musculoskeletal: No lower extremity tenderness nor edema.   Neurologic:  Normal speech and language.  Skin:  Skin is warm, dry and intact.  Psychiatric: Mood and affect are normal.   ____________________________________________   LABS (all labs ordered are listed, but only abnormal results are displayed)  Labs Reviewed  BASIC METABOLIC PANEL - Abnormal; Notable for the following components:      Result Value   Glucose, Bld 108 (*)    All other components within normal limits  CBC - Abnormal; Notable for the following components:   RBC 5.26 (*)    All other components within normal limits  TROPONIN I  TROPONIN I   ____________________________________________  EKG  ED ECG REPORT I, Loney Hering, the attending physician, personally viewed and interpreted this ECG.   Date: 09/15/2017  EKG Time: 101  Rate: 74  Rhythm: normal sinus rhythm  Axis: left axis deviation  Intervals:none  ST&T Change: none  ____________________________________________  RADIOLOGY  ED MD interpretation:  CXR: no active cardiopulmonary disease  Official radiology report(s): Dg Chest 2 View  Result Date: 09/15/2017 CLINICAL DATA:  Upper chest pain and indigestion. EXAM: CHEST - 2 VIEW COMPARISON:  05/11/2012 FINDINGS: The heart size and mediastinal contours are within normal limits. Both lungs are clear. Stable degenerative change along the thoracic spine. IMPRESSION: No active cardiopulmonary disease. Electronically Signed   By: Ashley Royalty M.D.   On: 09/15/2017 02:04    ____________________________________________   PROCEDURES  Procedure(s) performed: None  Procedures  Critical Care performed:  No  ____________________________________________   INITIAL IMPRESSION / ASSESSMENT AND PLAN / ED COURSE  As part of my medical decision making, I reviewed the following data within the electronic MEDICAL RECORD NUMBER Notes from prior ED visits and [place] Controlled Substance Database   This is a 67 year old female who comes into the hospital today with some chest pain.  The patient states that it felt like indigestion but she was concerned so she decided to come into the hospital for further evaluation.  The patient had some blood work drawn and it was unremarkable.  The patient had a CBC and a BMP.  She also had a troponin which was negative.  The patient's chest x-ray did not show any acute findings.  By the time I evaluated the patient her pain was gone but I did give her a GI cocktail.  The patient had  a repeat troponin which was also negative.  She will be discharged home and encouraged to follow-up with her primary care physician.      ____________________________________________   FINAL CLINICAL IMPRESSION(S) / ED DIAGNOSES  Final diagnoses:  Chest pain, unspecified type     ED Discharge Orders        Ordered    omeprazole (PRILOSEC) 40 MG capsule  Daily     09/15/17 0808       Note:  This document was prepared using Dragon voice recognition software and may include unintentional dictation errors.    Loney Hering, MD 09/15/17 [phone]

## 2017-09-21 ENCOUNTER — Inpatient Hospital Stay: Payer: Self-pay | Admitting: Family Medicine

## 2017-09-22 ENCOUNTER — Encounter: Payer: Self-pay | Admitting: Family Medicine

## 2017-09-22 ENCOUNTER — Ambulatory Visit (INDEPENDENT_AMBULATORY_CARE_PROVIDER_SITE_OTHER): Payer: Medicare Other | Admitting: Family Medicine

## 2017-09-22 VITALS — BP 122/67 | HR 76 | Temp 98.1°F | Resp 16 | Ht 62.0 in | Wt 168.0 lb

## 2017-09-22 DIAGNOSIS — K219 Gastro-esophageal reflux disease without esophagitis: Secondary | ICD-10-CM | POA: Diagnosis not present

## 2017-09-22 MED ORDER — SUCRALFATE 1 G PO TABS
1.0000 g | ORAL_TABLET | Freq: Three times a day (TID) | ORAL | 0 refills | Status: DC
Start: 2017-09-22 — End: 2017-10-28

## 2017-09-22 MED ORDER — OMEPRAZOLE 40 MG PO CPDR: 40.0000 mg | DELAYED_RELEASE_CAPSULE | Freq: Every day | ORAL | 0 refills | Status: DC

## 2017-09-22 NOTE — Patient Instructions (Addendum)
Thank you for coming to the office today.  Your chest pain was likely caused by - uncontrolled Acid Reflux (GERD) and may have developed an Ulcer (Peptic Ulcer of stomach).  Starting tomorrow continue current med Omeprazole 40mg  daily. Prefer to take this med about 30 min before breakfast or 1st meal of day for next 2-3 weeks, don't stop taking unless we discuss first  FINISH LAST 1 week of your current med and then [place] of rx that I sent  Take other prescribed medicine Carafate (Sucralfate) as needed up to 4 times daily (3 meals and bedtime)  to coat stomach lining to ease symptoms, if it helps reduce symptoms then it is more likely to be due to acid and/or ulcer.  DIET RECOMMENDATIONS - Avoid spicy, greasy, fried foods, also things like caffeine, dark chocolate, peppermint can worsen - Avoid large meals and late night snacks, also do not go more than 4-5 hours without a snack or meal (not eating will worsen reflux symptoms due to stomach acid) - You may also elevate the head of your bed at night to sleep at very slight incline to help reduce symptoms  If the problem improves but keeps coming back, we can discuss higher dose or longer course at next visit.  If symptoms are worsening or persistent despite treatment or develop any different severe esophagus or abdominal pain, unable to swallow solids or liquids, nausea, vomiting especially blood in vomit, fever/chills, or unintentional weight loss / no appetite, please follow-up sooner in office or seek more immediate medical attention at hospital Emergency Department.  Regarding other medicines:  - STOP taking Ibuprofen, Advil, Motrin, Goody's / BC powder - DO NOT take without discussing with your doctor. These medicines can put you at high risk for future bleeding.  If need pain medicine, may take Tylenol Extra Strength (Acetaminophen) 500mg  tabs - take 1 to 2 tabs per dose (max 1000mg ) every 6-8 hours for pain (take  regularly, don't skip a dose for next 7 days), max 24 hour daily dose is 6 tablets or 3000mg . In the future you can repeat the same everyday Tylenol course for 1-2 weeks at a time.  DUE for FASTING BLOOD WORK (no food or drink after midnight before the lab appointment, only water or coffee without cream/sugar on the morning of)  SCHEDULE "Lab Only" visit in the morning at the clinic for lab draw in 4 WEEKS   - Make sure Lab Only appointment is at about 1 week before your next appointment, so that results will be available  For Lab Results, once available within 2-3 days of blood draw, you can can log in to MyChart online to view your results and a brief explanation. Also, we can discuss results at next follow-up visit.  Please schedule a Follow-up Appointment to: Return in about 1 month (around 10/20/2017) for Yearly Medicare Checkup (GERD f/u).  If you have any other questions or concerns, please feel free to call the office or send a message through [place]. You may also schedule an earlier appointment if necessary.  Additionally, you may be receiving a survey about your experience at our office within a few days to 1 week by e-mail or mail. We value your feedback.  Nobie Putnam, DO [place]

## 2017-09-22 NOTE — Progress Notes (Signed)
Subjective:    Patient ID: Brittney Schmidt, female    DOB: 08-24-1950, 67 y.o.   MRN: [id number]  Brittney Schmidt is a 67 y.o. female presenting on 09/22/2017 for Hospitalization Follow-up (chest pain)   HPI   ED FOLLOW-UP VISIT  Hospital/Location: [facility] ED Date of ED Visit: 09/15/17  Reason for Presenting to ED: Chest Pain Primary (+Secondary) Diagnosis: GERD / Heartburn, chest pain atypical ruled out MI  FOLLOW-UP - ED provider note and record have been reviewed - Patient presents today about 7 days after recent ED visit. Brief summary of recent course, patient had symptoms of episodic chest pain described as burning severe up to 8 to 9 out of 10, every few seconds middle of chest, onset after eating some oatmeal cookies and grapes, was resting and seated on couch that evening when onset of symptoms she was not exerting herself, presented to ED that evening, testing in ED with labs chemistry BMET and CBC, also had troponin negative and delta troponin repeated hours later negative, also had Chest X-ray that was negative. She was given GI cocktail with significant relief, and also given rx Omeprazole 40mg  on discharge for 2 week supply  - Today reports overall has done well after discharge from ED. Symptoms of chest pain have resolved. She still has some episodic heartburn or acid reflux symptoms, much less severe now only mild, episodic worse with eating certain foods. - She is taking Omeprazole 40mg  but she is taking it AFTER she eats, not before, has taken 1 week so far with improvement - Not taking any OTC antacids at this time - No known cardiac history before - She takes Ibuprofen OTC occasionally, not several times weekly. Not started regular aspirin either  Denies active chest pain, dyspnea, exertional symptoms, fatigue, dizziness, lightheadedness, syncope, nausea vomiting, persistent active abdominal pain, diarrhea, blood in stool or dark stools  - New medications on discharge:  Omeprazole 40mg  x 2 week supply - Changes to current meds on discharge: None   Depression screen [facility] 2/9 09/22/2017 06/04/2016 06/14/2015  Decreased Interest 0 0 0  Down, Depressed, Hopeless 0 0 0  PHQ - 2 Score 0 0 0    Social History   Tobacco Use  . Smoking status: Never Smoker  . Smokeless tobacco: Never Used  Substance Use Topics  . Alcohol use: Yes    Alcohol/week: 1.2 oz    Types: 2 Cans of beer per week    Comment: occasional, planning to quit completely  . Drug use: No    Review of Systems Per HPI unless specifically indicated above     Objective:    BP 122/67   Pulse 76   Temp 98.1 F (36.7 C) (Oral)   Resp 16   Ht 5\' 2"  (1.575 m)   Wt 168 lb (76.2 kg)   BMI 30.73 kg/m   Wt Readings from Last 3 Encounters:  09/22/17 168 lb (76.2 kg)  09/15/17 165 lb (74.8 kg)  08/10/17 166 lb (75.3 kg)    Physical Exam  Constitutional: She is oriented to person, place, and time. She appears well-developed and well-nourished. No distress.  Well-appearing, comfortable, cooperative, obese  HENT:  Head: Normocephalic and atraumatic.  Mouth/Throat: Oropharynx is clear and moist.  Eyes: Conjunctivae are normal. Right eye exhibits no discharge. Left eye exhibits no discharge.  Cardiovascular: Normal rate, regular rhythm, normal heart sounds and intact distal pulses.  No murmur heard. Pulmonary/Chest: Effort normal and breath sounds normal. No respiratory  distress. She has no wheezes. She has no rales. She exhibits no tenderness (Non tender chest wall).  Abdominal: Soft. Bowel sounds are normal. She exhibits no distension and no mass. There is no tenderness. There is no guarding.  Musculoskeletal: Normal range of motion. She exhibits no edema.  Neurological: She is alert and oriented to person, place, and time.  Skin: Skin is warm and dry. No rash noted. She is not diaphoretic. No erythema.  Psychiatric: She has a normal mood and affect. Her behavior is normal.  Well groomed,  good eye contact, normal speech and thoughts  Nursing note and vitals reviewed.    I have personally reviewed the radiology report from 09/15/17 on Chest X-ray.  CLINICAL DATA: Upper chest pain and indigestion.  EXAM: CHEST - 2 VIEW  COMPARISON: 05/11/2012  FINDINGS: The heart size and mediastinal contours are within normal limits. Both lungs are clear. Stable degenerative change along the thoracic spine.  IMPRESSION: No active cardiopulmonary disease.   Electronically Signed By: Ashley Royalty M.D. On: 09/15/2017 02:04   Results for orders placed or performed during the hospital encounter of 72/09/47  Basic metabolic panel  Result Value Ref Range   Sodium 139 135 - 145 mmol/L   Potassium 3.5 3.5 - 5.1 mmol/L   Chloride 101 98 - 111 mmol/L   CO2 30 22 - 32 mmol/L   Glucose, Bld 108 (H) 70 - 99 mg/dL   BUN 10 8 - 23 mg/dL   Creatinine, Ser 0.68 0.44 - 1.00 mg/dL   Calcium 9.6 8.9 - 10.3 mg/dL   GFR calc non Af Amer >60 >60 mL/min   GFR calc Af Amer >60 >60 mL/min   Anion gap 8 5 - 15  CBC  Result Value Ref Range   WBC 6.9 3.6 - 11.0 K/uL   RBC 5.26 (H) 3.80 - 5.20 MIL/uL   Hemoglobin 14.3 12.0 - 16.0 g/dL   HCT 43.9 35.0 - 47.0 %   MCV 83.5 80.0 - 100.0 fL   MCH 27.1 26.0 - 34.0 pg   MCHC 32.4 32.0 - 36.0 g/dL   RDW 14.1 11.5 - 14.5 %   Platelets 234 150 - 440 K/uL  Troponin I  Result Value Ref Range   Troponin I <0.03 <0.03 ng/mL  Troponin I  Result Value Ref Range   Troponin I <0.03 <0.03 ng/mL      Assessment & Plan:   Problem List Items Addressed This Visit    Gastroesophageal reflux disease - Primary    Suspected acute flare on chronic GERD with recent worsening due to trigger foods and dietary habits - No GI red flag symptoms. Exam is unremarkable with benign abdomen without pain today - Chronic history of GERD, only intermittent and inadequate treatments in past - History not suggestive of PUD (she does use PRN Ibuprofen NSAID but not regularly) -  Clinically not consistent with cardiac chest pain on history and exam and review of ED visit with negative delta troponin and other work-up  Plan: 1. Discussed dx of GERD and symptoms - Finish current rx Omeprazole 40mg  daily for 1 more week - was given 2 week supply from hospital ED, she should switch dosing to BEFORE breakfast, instead of after - Then given new rx Omeprazole 40mg  daily 30 min prior to 1st meal for 2 to 4 weeks - after next 2 weeks on the new rx she may then TAPER DOWN to [place] and then slowly taper off 2. Diet modifications  reduce GERD, smaller meals, avoid late night eating, avoid laying down after meal 3. Also given rx Carafate PRN 4. Follow-up 4-6 weeks as needed and for Welcome to Medicare Visit       Relevant Medications   omeprazole (PRILOSEC) 40 MG capsule   sucralfate (CARAFATE) 1 g tablet      Meds ordered this encounter  Medications  . omeprazole (PRILOSEC) 40 MG capsule    Sig: Take 1 capsule (40 mg total) by mouth daily before breakfast. For 2 weeks, then switch to every OTHER day, and slowly taper off    Dispense:  30 capsule    Refill:  0  . sucralfate (CARAFATE) 1 g tablet    Sig: Take 1 tablet (1 g total) by mouth 4 (four) times daily -  with meals and at bedtime. As needed    Dispense:  30 tablet    Refill:  0    Follow up plan: Return in about 1 month (around 10/20/2017) for Welcome to Medicare.  Future labs will be deferred until after upcoming Welcome to Medicare visit.  Nobie Putnam, [facility] 09/22/2017, 3:12 PM

## 2017-09-22 NOTE — Assessment & Plan Note (Signed)
Suspected acute flare on chronic GERD with recent worsening due to trigger foods and dietary habits - No GI red flag symptoms. Exam is unremarkable with benign abdomen without pain today - Chronic history of GERD, only intermittent and inadequate treatments in past - History not suggestive of PUD (she does use PRN Ibuprofen NSAID but not regularly) - Clinically not consistent with cardiac chest pain on history and exam and review of ED visit with negative delta troponin and other work-up  Plan: 1. Discussed dx of GERD and symptoms - Finish current rx Omeprazole 40mg  daily for 1 more week - was given 2 week supply from hospital ED, she should switch dosing to BEFORE breakfast, instead of after - Then given new rx Omeprazole 40mg  daily 30 min prior to 1st meal for 2 to 4 weeks - after next 2 weeks on the new rx she may then TAPER DOWN to [place] and then slowly taper off 2. Diet modifications reduce GERD, smaller meals, avoid late night eating, avoid laying down after meal 3. Also given rx Carafate PRN 4. Follow-up 4-6 weeks as needed and for Welcome to [facility] Visit

## 2017-10-28 ENCOUNTER — Encounter: Payer: Self-pay | Admitting: Family Medicine

## 2017-10-28 ENCOUNTER — Other Ambulatory Visit: Payer: Self-pay | Admitting: Family Medicine

## 2017-10-28 ENCOUNTER — Ambulatory Visit (INDEPENDENT_AMBULATORY_CARE_PROVIDER_SITE_OTHER): Payer: Medicare Other | Admitting: Family Medicine

## 2017-10-28 VITALS — BP 128/65 | HR 76 | Temp 98.4°F | Resp 16 | Ht 62.0 in | Wt 172.0 lb

## 2017-10-28 DIAGNOSIS — E669 Obesity, unspecified: Secondary | ICD-10-CM

## 2017-10-28 DIAGNOSIS — E2839 Other primary ovarian failure: Secondary | ICD-10-CM

## 2017-10-28 DIAGNOSIS — I10 Essential (primary) hypertension: Secondary | ICD-10-CM

## 2017-10-28 DIAGNOSIS — Z23 Encounter for immunization: Secondary | ICD-10-CM

## 2017-10-28 DIAGNOSIS — R7309 Other abnormal glucose: Secondary | ICD-10-CM

## 2017-10-28 DIAGNOSIS — Z0000 Encounter for general adult medical examination without abnormal findings: Secondary | ICD-10-CM | POA: Diagnosis not present

## 2017-10-28 DIAGNOSIS — K219 Gastro-esophageal reflux disease without esophagitis: Secondary | ICD-10-CM

## 2017-10-28 DIAGNOSIS — Z1159 Encounter for screening for other viral diseases: Secondary | ICD-10-CM

## 2017-10-28 NOTE — Patient Instructions (Addendum)
Thank you for coming to the office today.  I am not worried about your memory symptoms. I would continue using the calendar method as you are. If memory becomes a problem then notify us sooner. We can check this yearly for now.  Recommend High Dose Flu Vaccine - please call us back to schedule a NURSE ONLY VISIT for flu shot in the Fall 2019  1st dose Prevnar-13 today for pneumonia. Next due in 1 year in 2020.  For DEXA Scan (Bone mineral density) screening for osteoporosis  Call the [place] below anytime to schedule your own appointment now that order has been placed.  [facility] [place], [place] [address] Phone: [phone]  DUE for FASTING BLOOD WORK (no food or drink after midnight before the lab appointment, only water or coffee without cream/sugar on the morning of)  SCHEDULE "Lab Only" visit in the morning at the clinic for lab draw in 6 MONTHS   - Make sure Lab Only appointment is at about 1 week before your next appointment, so that results will be available  For Lab Results, once available within 2-3 days of blood draw, you can can log in to MyChart online to view your results and a brief explanation. Also, we can discuss results at next follow-up visit.  -------------------------------------------------------  Colon Cancer Screening: - For all adults age 24+ routine colon cancer screening is highly recommended.     - Recent guidelines from [place] recommend starting age of 60 - Early detection of colon cancer is important, because often there are no warning signs or symptoms, also if found early usually it can be cured. Late stage is hard to treat.  - If you are not interested in Colonoscopy screening (if done and normal you could be cleared for 5 to 10 years until next due), then Cologuard is an excellent alternative for screening test for Colon Cancer. It is highly sensitive for  detecting DNA of colon cancer from even the earliest stages. Also, there is NO bowel prep required. - If Cologuard is NEGATIVE, then it is good for 3 years before next due - If Cologuard is POSITIVE, then it is strongly advised to get a Colonoscopy, which allows the GI doctor to locate the source of the cancer or polyp (even very early stage) and treat it by removing it. ------------------------- If you would like to proceed with Cologuard (stool DNA test) - FIRST, call your insurance company and tell them you want to check cost of Cologuard tell them CPT Code [phone] (it may be completely covered and you could get for no cost, OR max cost without any coverage is about $600). Also, keep in mind if you do NOT open the kit, and decide not to do the test, you will NOT be charged, you should contact the company if you decide not to do the test. - If you want to proceed, you can notify us (phone message, [place], or at next visit) and we will order it for you. The test kit will be delivered to you house within about 1 week. Follow instructions to collect sample, you may call the company for any help or questions, 24/7 telephone support at [phone].   Please schedule a Follow-up Appointment to: Return in about 6 months (around 65/09/2018) for Yearly Medicare Check-up.  If you have any other questions or concerns, please feel free to call the office or send a message through  MyChart. You may also schedule an earlier appointment if necessary.  Additionally, you may be receiving a survey about your experience at our office within a few days to 1 week by e-mail or mail. We value your feedback.  Nobie Putnam, DO [place]

## 2017-10-28 NOTE — Progress Notes (Signed)
Patient: Brittney Schmidt, Female    DOB: 08-Mar-1951, 67 y.o.   MRN: [id number]  Visit Date: 10/28/2017  Today's Provider: Nobie Putnam, DO   Chief Complaint  Patient presents with  . medicare physical    Subjective:   Brittney Schmidt is a 67 y.o. female who presents today for "Welcome to Medicare Visit"  Welcome to [employer] Wellness Visit:  HPI   Agrees to have screening EKG today. Last done 08/2017  Chart reviewed and updated. Psychosocial and medical history were reviewed. Active issues reviewed and addressed as documented below.  Additional concerns today:  Follow-up GERD - recently seen 09/2017 after ED visit for atypical chest pain and GERD, she was extended on omeprazole 40mg  and asked to taper down gradually once resolved. Today admits no further significant problems with GERD, symptoms improved and she has been off of PPI.  Review of Systems  See above  Past Medical History:  Diagnosis Date  . Ankle swelling   . Hypertension     Past Surgical History:  Procedure Laterality Date  . TUBAL LIGATION      Family History  Problem Relation Age of Onset  . Diabetes Mother   . Hypertension Mother   . Colon polyps Father   . Heart disease Father   . Heart attack Father   . Cancer Brother   . Hypertension Son   . Breast cancer Neg Hx   . Colon cancer Neg Hx     Social History   Socioeconomic History  . Marital status: Single    Spouse name: Not on file  . Number of children: 3  . Years of education: GED  . Highest education level: Not on file  Occupational History  . 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 Financial resource strain: Not on file  . Food insecurity:    Worry: Not on file    Inability: Not on file  . Transportation needs:    Medical: Not on file    Non-medical: Not on file  Tobacco Use  . Smoking status: Never Smoker  . Smokeless tobacco: Never Used  Substance and Sexual Activity  . Alcohol use: Yes     Alcohol/week: 1.2 oz    Types: 2 Cans of beer per week    Comment: occasional, planning to quit completely  . Drug use: No  . Sexual activity: Not Currently    Partners: Male  Lifestyle  . Physical activity:    Days per week: Not on file    Minutes per session: Not on file  . Stress: Not on file  Relationships  . Social connections:    Talks on phone: Not on file    Gets together: Not on file    Attends religious service: Not on file    Active member of club or organization: Not on file    Attends meetings of clubs or organizations: Not on file    Relationship status: Not on file  . Intimate partner violence:    Fear of current or ex partner: Not on file    Emotionally abused: Not on file    Physically abused: Not on file    Forced sexual activity: Not on file  Other Topics Concern  . Not on file  Social History Narrative  . Not on file    Outpatient Encounter Medications as of 10/28/2017  Medication Sig  . aspirin 81 MG tablet Take 81 mg by mouth daily.  Marland Kitchen ibuprofen (ADVIL,MOTRIN)  200 MG tablet Take 200 mg by mouth every 6 (six) hours as needed.  Marland Kitchen lisinopril-hydrochlorothiazide (PRINZIDE,ZESTORETIC) 20-25 MG tablet TAKE 1 TABLET BY MOUTH ONCE DAILY  . [DISCONTINUED] omeprazole (PRILOSEC) 40 MG capsule Take 1 capsule (40 mg total) by mouth daily before breakfast. For 2 weeks, then switch to every OTHER day, and slowly taper off  . [DISCONTINUED] meloxicam (MOBIC) 15 MG tablet Take 1 tablet (15 mg total) by mouth daily. (Patient not taking: Reported on 10/28/2017)  . [DISCONTINUED] methocarbamol (ROBAXIN) 500 MG tablet Take 1 tablet (500 mg total) by mouth 3 (three) times daily. (Patient not taking: Reported on 10/28/2017)  . [DISCONTINUED] sucralfate (CARAFATE) 1 g tablet Take 1 tablet (1 g total) by mouth 4 (four) times daily -  with meals and at bedtime. As needed (Patient not taking: Reported on 10/28/2017)   No facility-administered encounter medications on file as of  10/28/2017.     Functional Status Survey: Is the patient deaf or have difficulty hearing?: No Does the patient have difficulty seeing, even when wearing glasses/contacts?: No Does the patient have difficulty concentrating, remembering, or making decisions?: No Does the patient have difficulty walking or climbing stairs?: No Does the patient have difficulty dressing or bathing?: No Does the patient have difficulty doing errands alone such as visiting a doctor's office or shopping?: No  She admits to having some difficulty with managing medications - she had a few episodes of forgetting to take pill due to other factors (babysitting family member or other activities), she uses a calendar now to keep track of her medications, to document if she took a pill or not.  Fall Risk Assessment Fall Risk  10/28/2017 09/22/2017 06/04/2016 06/14/2015  Falls in the past year? No No Yes No  Number falls in past yr: - - 2 or more -  Injury with Fall? - - No -  Risk for fall due to : - - Other (Comment) -  Risk for fall due to: Comment - - wet floor at home, improper shoes on ladder. -   Depression Screen See under rooming Depression screen [facility] 2/9 10/28/2017 09/22/2017 06/04/2016 06/14/2015  Decreased Interest 0 0 0 0  Down, Depressed, Hopeless 0 0 0 0  PHQ - 2 Score 0 0 0 0    Advanced Directives Does patient have a HCPOA?    no   Objective:   Vitals: BP 128/65   Pulse 76   Temp 98.4 F (36.9 C) (Oral)   Resp 16   Ht 5\' 2"  (1.575 m)   Wt 172 lb (78 kg)   BMI 31.46 kg/m  Body mass index is 31.46 kg/m.  Filed Weights   10/28/17 1519  Weight: 172 lb (78 kg)     Hearing Screening   125Hz  250Hz  500Hz  1000Hz  2000Hz  3000Hz  4000Hz  6000Hz  8000Hz   Right ear:   Pass Pass Pass  Pass    Left ear:   Pass Pass Pass  Pass    Comments: On 40 dBHL.   Visual Acuity Screening   Right eye Left eye Both eyes  Without correction:     With correction: 20/40 20/25 20/25     Physical Exam  Constitutional:  She is oriented to person, place, and time. She appears well-developed and well-nourished. No distress.  Well-appearing, comfortable, cooperative, obese  HENT:  Head: Normocephalic and atraumatic.  Eyes: Conjunctivae are normal.  Cardiovascular: Normal rate.  Pulmonary/Chest: Effort normal.  Musculoskeletal: She exhibits no edema.  Neurological: She is alert and oriented  to person, place, and time.  Skin: No rash noted. She is not diaphoretic.  Psychiatric: She has a normal mood and affect. Her behavior is normal.  Well groomed, good eye contact, normal speech and thoughts  Nursing note and vitals reviewed.  EKG - performed in office today  Date: 10/28/17  Rate: 66  Rhythm: normal sinus rhythm  QRS Axis: normal  Intervals: normal  ST/T Wave abnormalities: normal  Conduction Disutrbances:none  Additional Narrative Interpretation: baseline artifact due to body habitus, repeated EKG  Old EKG Reviewed: unchanged and compared to last EKG 09/15/17   MMSE - Mini Mental State Exam 10/28/2017  Orientation to time 5  Orientation to Place 5  Registration 3  Attention/ Calculation 5  Recall 1  Language- name 2 objects 2  Language- repeat 1  Language- follow 3 step command 3  Language- read & follow direction 1  Write a sentence 1  Copy design 1  Total score 28    Assessment & Plan:     Annual Wellness Visit  Reviewed patient's Family Medical History Reviewed and updated list of patient's medical providers Assessment of cognitive impairment was done Assessed patient's functional ability Established a written schedule for health screening [place] Completed and Reviewed  Offered Cologuard - she will consider and notify us Prevnar-13 done today, next Pneumovax-23 in 1 year, 10/2018 Flu vaccine return in Fall 2019 DEXA - ordered - she will schedule  Exercise Activities and Dietary recommendations Goals    . Weight (lb) < 200 lb (90.7 kg)     Wants to be at  150 pounds in 3 months.       Immunization History  Administered Date(s) Administered  . Pneumococcal Conjugate-13 10/28/2017  . Tdap 10/17/2015    Health Maintenance  Topic Date Due  . Hepatitis C Screening  05/02/1950  . HIV Screening  10/22/1967  . COLONOSCOPY  10/22/2002  . DEXA SCAN  10/21/2017  . INFLUENZA VACCINE  10/22/2017  . PNA vac Low Risk Adult (2 of 2 - PPSV23) 10/29/2018  . PAP SMEAR  07/31/2019  . MAMMOGRAM  08/11/2019  . TETANUS/TDAP  12/22/2025    Discussed health benefits of physical activity, and encouraged her to engage in regular exercise appropriate for her age and condition.   No orders of the defined types were placed in this encounter.  Orders Placed This Encounter  Procedures  . DG Bone Density    Standing Status:   Future    Standing Expiration Date:   12/29/2018    Order Specific Question:   Reason for Exam (SYMPTOM  OR DIAGNOSIS REQUIRED)    Answer:   postmenopausal estrogen deficiency, routine screen for OP    Order Specific Question:   Preferred imaging location?    Answer:   [facility]  . Pneumococcal conjugate vaccine 13-valent IM     Current Outpatient Medications:  .  aspirin 81 MG tablet, Take 81 mg by mouth daily., Disp: , Rfl:  .  ibuprofen (ADVIL,MOTRIN) 200 MG tablet, Take 200 mg by mouth every 6 (six) hours as needed., Disp: , Rfl:  .  lisinopril-hydrochlorothiazide (PRINZIDE,ZESTORETIC) 20-25 MG tablet, TAKE 1 TABLET BY MOUTH ONCE DAILY, Disp: 90 tablet, Rfl: 1 Medications Discontinued During This Encounter  Medication Reason  . omeprazole (PRILOSEC) 40 MG capsule Completed Course  . sucralfate (CARAFATE) 1 g tablet Completed Course  . methocarbamol (ROBAXIN) 500 MG tablet Completed Course  . meloxicam (MOBIC) 15 MG tablet Completed Course  She will return in 6 months for Yearly Medicare Checkup and fasting labs.  Next Medicare Wellness Visit in 12+ months  Nobie Putnam, [facility] 10/28/2017, 5:04 PM

## 2018-03-09 DIAGNOSIS — Q6689 Other  specified congenital deformities of feet: Secondary | ICD-10-CM | POA: Diagnosis not present

## 2018-03-09 DIAGNOSIS — M779 Enthesopathy, unspecified: Secondary | ICD-10-CM | POA: Diagnosis not present

## 2018-03-09 DIAGNOSIS — M2042 Other hammer toe(s) (acquired), left foot: Secondary | ICD-10-CM | POA: Diagnosis not present

## 2018-04-14 ENCOUNTER — Ambulatory Visit: Payer: Medicare Other

## 2018-04-14 DIAGNOSIS — Z23 Encounter for immunization: Secondary | ICD-10-CM

## 2018-04-19 ENCOUNTER — Emergency Department
Admission: EM | Admit: 2018-04-19 | Discharge: 2018-04-19 | Disposition: A | Discharge status: Home / Self Care | Payer: Medicare Other | Attending: Emergency Medicine | Admitting: Emergency Medicine

## 2018-04-19 ENCOUNTER — Other Ambulatory Visit: Payer: Self-pay

## 2018-04-19 ENCOUNTER — Encounter: Payer: Self-pay | Admitting: Emergency Medicine

## 2018-04-19 DIAGNOSIS — Z7982 Long term (current) use of aspirin: Secondary | ICD-10-CM | POA: Diagnosis not present

## 2018-04-19 DIAGNOSIS — K2900 Acute gastritis without bleeding: Secondary | ICD-10-CM | POA: Insufficient documentation

## 2018-04-19 DIAGNOSIS — Z79899 Other long term (current) drug therapy: Secondary | ICD-10-CM | POA: Diagnosis not present

## 2018-04-19 DIAGNOSIS — R1013 Epigastric pain: Secondary | ICD-10-CM | POA: Diagnosis present

## 2018-04-19 DIAGNOSIS — I10 Essential (primary) hypertension: Secondary | ICD-10-CM | POA: Diagnosis not present

## 2018-04-19 LAB — URINALYSIS, COMPLETE (UACMP) WITH MICROSCOPIC
BACTERIA UA: NONE SEEN
Bilirubin Urine: NEGATIVE
Glucose, UA: NEGATIVE mg/dL
KETONES UR: NEGATIVE mg/dL
LEUKOCYTES UA: NEGATIVE
NITRITE: NEGATIVE
PH: 5 (ref 5.0–8.0)
Protein, ur: NEGATIVE mg/dL
Specific Gravity, Urine: 1.012 (ref 1.005–1.030)

## 2018-04-19 LAB — COMPREHENSIVE METABOLIC PANEL
ALT: 17 U/L (ref 0–44)
ANION GAP: 6 (ref 5–15)
AST: 20 U/L (ref 15–41)
Albumin: 3.9 g/dL (ref 3.5–5.0)
Alkaline Phosphatase: 48 U/L (ref 38–126)
BUN: 12 mg/dL (ref 8–23)
CHLORIDE: 104 mmol/L (ref 98–111)
CO2: 30 mmol/L (ref 22–32)
Calcium: 9.1 mg/dL (ref 8.9–10.3)
Creatinine, Ser: 0.65 mg/dL (ref 0.44–1.00)
GFR calc non Af Amer: >60 mL/min (ref >60)
Glucose, Bld: 114 mg/dL — ABNORMAL HIGH (ref 70–99)
Potassium: 3.2 mmol/L — ABNORMAL LOW (ref 3.5–5.1)
SODIUM: 140 mmol/L (ref 135–145)
Total Bilirubin: 0.8 mg/dL (ref 0.3–1.2)
Total Protein: 7.2 g/dL (ref 6.5–8.1)

## 2018-04-19 LAB — CBC
HEMATOCRIT: 40.8 % (ref 36.0–46.0)
Hemoglobin: 12.6 g/dL (ref 12.0–15.0)
MCH: 26.9 pg (ref 26.0–34.0)
MCHC: 30.9 g/dL (ref 30.0–36.0)
MCV: 87 fL (ref 80.0–100.0)
NRBC: 0 % (ref 0.0–0.2)
PLATELETS: 186 10*3/uL (ref 150–400)
RBC: 4.69 MIL/uL (ref 3.87–5.11)
RDW: 13.1 % (ref 11.5–15.5)
WBC: 4.4 10*3/uL (ref 4.0–10.5)

## 2018-04-19 LAB — LIPASE, BLOOD: LIPASE: 38 U/L (ref 11–51)

## 2018-04-19 MED ORDER — SODIUM CHLORIDE 0.9% FLUSH: 3.0000 mL | Freq: Once | INTRAVENOUS | Status: DC

## 2018-04-19 MED ORDER — OMEPRAZOLE 40 MG PO CPDR: 40.0000 mg | DELAYED_RELEASE_CAPSULE | Freq: Every day | ORAL | 1 refills | Status: DC

## 2018-04-19 NOTE — ED Provider Notes (Signed)
[facility] Emergency Department Provider Note   ____________________________________________    I have reviewed the triage vital signs and the nursing notes.   HISTORY  Chief Complaint Abdominal Pain     HPI Brittney Schmidt is a 68 y.o. female who presents with complaints of abdominal pain.  Patient describes waking up in the middle of the night with a burning epigastric pain over the last several days.  She does report in the past she has had problems with GERD, used to be on Prilosec but no longer takes this.  She denies nausea or vomiting.  No chest pain.  Pain does not radiate.  Seems to get better when she stands up.  Past Medical History:  Diagnosis Date  . Ankle swelling   . Hypertension     Patient Active Problem List   Diagnosis Date Noted  . Gastroesophageal reflux disease 09/22/2017  . Obesity (BMI 30.0-34.9) 06/04/2016  . Hypertension 06/14/2015    Past Surgical History:  Procedure Laterality Date  . TUBAL LIGATION      Prior to Admission medications   Medication Sig Start Date End Date Taking? Authorizing Provider  aspirin 81 MG tablet Take 81 mg by mouth daily.    [provider]  ibuprofen (ADVIL,MOTRIN) 200 MG tablet Take 200 mg by mouth every 6 (six) hours as needed.    [provider]  lisinopril-hydrochlorothiazide (PRINZIDE,ZESTORETIC) 20-25 MG tablet TAKE 1 TABLET BY MOUTH ONCE DAILY 09/10/17   Parks Ranger, Devonne Doughty, DO  omeprazole (PRILOSEC) 40 MG capsule Take 1 capsule (40 mg total) by mouth daily. 04/19/18 04/19/19  Lavonia Drafts, MD     Allergies Patient has no known allergies.  Family History  Problem Relation Age of Onset  . Diabetes Mother   . Hypertension Mother   . Colon polyps Father   . Heart disease Father   . Heart attack Father   . Cancer Brother   . Hypertension Son   . Breast cancer Neg Hx   . Colon cancer Neg Hx     Social History Social History   Tobacco Use  .  Smoking status: Never Smoker  . Smokeless tobacco: Never Used  Substance Use Topics  . Alcohol use: Yes    Alcohol/week: 2.0 standard drinks    Types: 2 Cans of beer per week    Comment: occasional, planning to quit completely  . Drug use: No    Review of Systems  Constitutional: No fever/chills Eyes: No visual changes.  ENT: No sore throat. Cardiovascular: Denies chest pain. Respiratory: Denies shortness of breath. Gastrointestinal: As above, normal stools.   Genitourinary: Negative for dysuria. Musculoskeletal: Negative for back pain. Skin: Negative for rash. Neurological: Occasional headaches, posterior   ____________________________________________   PHYSICAL EXAM:  VITAL SIGNS: ED Triage Vitals  Enc Vitals Group     BP 04/19/18 1122 128/81     Pulse Rate 04/19/18 1122 72     Resp 04/19/18 1122 20     Temp 04/19/18 1122 97.6 F (36.4 C)     Temp Source 04/19/18 1122 Oral     SpO2 04/19/18 1122 100 %     Weight 04/19/18 1123 70.3 kg (155 lb)     Height 04/19/18 1123 1.6 m (5\' 3" )     Head Circumference --      Peak Flow --      Pain Score 04/19/18 1123 5     Pain Loc --      Pain  Edu? --      Excl. in [place]? --     Constitutional: Alert and oriented.  Eyes: Conjunctivae are normal.   Nose: No congestion/rhinnorhea. Mouth/Throat: Mucous membranes are moist.    Cardiovascular: Normal rate, regular rhythm. Grossly normal heart sounds.  Good peripheral circulation. Respiratory: Normal respiratory effort.  No retractions. Lungs CTAB. Gastrointestinal: Soft and nontender. No distention.  No CVA tenderness.  Reassuring exam  Musculoskeletal:  Warm and well perfused Neurologic:  Normal speech and language. No gross focal neurologic deficits are appreciated.  Skin:  Skin is warm, dry and intact. No rash noted. Psychiatric: Mood and affect are normal. Speech and behavior are normal.  ____________________________________________   LABS (all labs ordered are  listed, but only abnormal results are displayed)  Labs Reviewed  COMPREHENSIVE METABOLIC PANEL - Abnormal; Notable for the following components:      Result Value   Potassium 3.2 (*)    Glucose, Bld 114 (*)    All other components within normal limits  URINALYSIS, COMPLETE (UACMP) WITH MICROSCOPIC - Abnormal; Notable for the following components:   Color, Urine STRAW (*)    APPearance CLEAR (*)    Hgb urine dipstick SMALL (*)    All other components within normal limits  LIPASE, BLOOD  CBC   ____________________________________________  EKG  None ____________________________________________  RADIOLOGY   ____________________________________________   PROCEDURES  Procedure(s) performed: No  Procedures   Critical Care performed: No ____________________________________________   INITIAL IMPRESSION / ASSESSMENT AND PLAN / ED COURSE  Pertinent labs & imaging results that were available during my care of the patient were reviewed by me and considered in my medical decision making (see chart for details).  Patient overall well-appearing and in no acute distress, symptoms suspicious for gastritis/GERD.  We will restart her on Prilosec to see if this helps.  No chest pain/diaphoresis/shortness of breath/pleurisy.  Reassuring abdominal exam.  No tenderness.  Recommend close outpatient follow-up, return precautions discussed    ____________________________________________   FINAL CLINICAL IMPRESSION(S) / ED DIAGNOSES  Final diagnoses:  Acute gastritis without hemorrhage, unspecified gastritis type        Note:  This document was prepared using Dragon voice recognition software and may include unintentional dictation errors.   Lavonia Drafts, MD 04/19/18 [phone]

## 2018-04-19 NOTE — ED Triage Notes (Signed)
First Nurse Note: ARrives c/o headache at night time "for a while" and stomach pain during the day.    AAOx3  Skin warm and dry. NAD

## 2018-04-19 NOTE — ED Notes (Signed)
Pt alert and oriented X4, active, cooperative, pt in NAD. RR even and unlabored, color WNL.  Pt informed to return if any life threatening symptoms occur.  Discharge and followup instructions reviewed. Ambulates safely. 

## 2018-04-19 NOTE — ED Triage Notes (Signed)
Abdominal pain x 1 week, usually occurs at night.

## 2018-05-04 ENCOUNTER — Other Ambulatory Visit: Payer: Self-pay | Admitting: Family Medicine

## 2018-05-04 DIAGNOSIS — I10 Essential (primary) hypertension: Secondary | ICD-10-CM

## 2018-05-31 ENCOUNTER — Ambulatory Visit (INDEPENDENT_AMBULATORY_CARE_PROVIDER_SITE_OTHER): Payer: Medicare Other | Admitting: Family Medicine

## 2018-05-31 ENCOUNTER — Other Ambulatory Visit: Payer: Self-pay

## 2018-05-31 ENCOUNTER — Encounter: Payer: Self-pay | Admitting: Family Medicine

## 2018-05-31 ENCOUNTER — Other Ambulatory Visit: Payer: Self-pay | Admitting: Family Medicine

## 2018-05-31 VITALS — BP 123/65 | HR 88 | Temp 98.3°F | Resp 16 | Ht 62.0 in | Wt 164.0 lb

## 2018-05-31 DIAGNOSIS — J0100 Acute maxillary sinusitis, unspecified: Secondary | ICD-10-CM | POA: Diagnosis not present

## 2018-05-31 DIAGNOSIS — E669 Obesity, unspecified: Secondary | ICD-10-CM

## 2018-05-31 DIAGNOSIS — R05 Cough: Secondary | ICD-10-CM | POA: Diagnosis not present

## 2018-05-31 DIAGNOSIS — R7309 Other abnormal glucose: Secondary | ICD-10-CM

## 2018-05-31 DIAGNOSIS — K219 Gastro-esophageal reflux disease without esophagitis: Secondary | ICD-10-CM

## 2018-05-31 DIAGNOSIS — I10 Essential (primary) hypertension: Secondary | ICD-10-CM

## 2018-05-31 DIAGNOSIS — Z1159 Encounter for screening for other viral diseases: Secondary | ICD-10-CM

## 2018-05-31 DIAGNOSIS — R058 Other specified cough: Secondary | ICD-10-CM

## 2018-05-31 MED ORDER — BENZONATATE 100 MG PO CAPS: 100.0000 mg | ORAL_CAPSULE | Freq: Three times a day (TID) | ORAL | 0 refills | Status: DC | PRN

## 2018-05-31 MED ORDER — IPRATROPIUM BROMIDE 0.06 % NA SOLN: 2.0000 | Freq: Four times a day (QID) | NASAL | 0 refills | Status: DC

## 2018-05-31 NOTE — Patient Instructions (Addendum)
Thank you for coming to the office today.  1. It sounds like you have persistent Sinus Congestion or "Rhinosinusitis" - I do not think that this is a Bacterial Sinus Infection. Usually these are caused by Viruses or Allergies, and will run it's course in about 7 to 10 days. - No antibiotics are needed  Start Atrovent nasal spray decongestant 2 sprays in each nostril up to 4 times daily for 7 days  Start Tessalon Perls take 1 capsule up to 3 times a day as needed for cough  May try OTC Mucinex up to 7-10 days then stop  - Improve hydration by drinking plenty of clear fluids (water, gatorade) to reduce secretions and thin congestion - Congestion draining down throat can cause irritation. May try warm herbal tea with honey, cough drops - Can take Tylenol or Ibuprofen as needed for fevers  If you develop persistent fever >101F for at least 3 consecutive days, headaches with sinus pain or pressure or persistent earache, please schedule a follow-up evaluation within next few days to week.   Please schedule a Follow-up Appointment to: Return in about 1 week (around 06/07/2018), or if symptoms worsen or fail to improve, for [facility].  If you have any other questions or concerns, please feel free to call the office or send a message through [place]. You may also schedule an earlier appointment if necessary.  Additionally, you may be receiving a survey about your experience at our office within a few days to 1 week by e-mail or mail. We value your feedback.  Nobie Putnam, DO [place]

## 2018-05-31 NOTE — Progress Notes (Signed)
Subjective:    Patient ID: Brittney Schmidt, female    DOB: 12-07-1950, 68 y.o.   MRN: [id number]  Brittney Schmidt is a 68 y.o. female presenting on 05/31/2018 for Cough (worst at night cough syrup not improving her Sxs onset last week HA, throat is sore from coughing)   HPI   PERSISTENT COUGH / URI Reports symptoms started 1 week ago with fever chills and cough, associated with episode of sweating. Seemed worse at night with cough. Tried OTC Cold & Flu medicine, cough Delsym did not resolve cough, tried Coricidin HBP - made her nauseas. - Today admits cough is easing up a little now. Seems fevers, chills have resolved. - Also one sick contact grandchild with URI symptoms, seemed to cause her symptoms again, otherwise no international travel or other sick contact with flu - Admits occasional headache - Denies any fever, chills sweats, nausea vomiting abdominal pain, diarrhea  Health Maintenance: UTD Flu Vaccine 03/2018  Depression screen [facility] 2/9 05/31/2018 10/28/2017 09/22/2017  Decreased Interest 0 0 0  Down, Depressed, Hopeless 0 0 0  PHQ - 2 Score 0 0 0    Social History   Tobacco Use  . Smoking status: Never Smoker  . Smokeless tobacco: Never Used  Substance Use Topics  . Alcohol use: Yes    Alcohol/week: 2.0 standard drinks    Types: 2 Cans of beer per week    Comment: occasional, planning to quit completely  . Drug use: No    Review of Systems Per HPI unless specifically indicated above     Objective:    BP 123/65   Pulse 88   Temp 98.3 F (36.8 C) (Oral)   Resp 16   Ht 5\' 2"  (1.575 m)   Wt 164 lb (74.4 kg)   SpO2 99%   BMI 30.00 kg/m   Wt Readings from Last 3 Encounters:  05/31/18 164 lb (74.4 kg)  04/19/18 155 lb (70.3 kg)  10/28/17 172 lb (78 kg)    Physical Exam Vitals signs and nursing note reviewed.  Constitutional:      General: She is not in acute distress.    Appearance: She is well-developed. She is not diaphoretic.     Comments: Well-appearing,  comfortable, cooperative  HENT:     Head: Normocephalic and atraumatic.     Comments: Mild maxillary sinuses tender. Nares with turbinate edema and congestion without purulence. Bilateral TMs clear without erythema, effusion or bulging. Oropharynx clear without erythema, exudates, edema or asymmetry. Eyes:     General:        Right eye: No discharge.        Left eye: No discharge.     Conjunctiva/sclera: Conjunctivae normal.  Neck:     Musculoskeletal: Normal range of motion and neck supple.     Thyroid: No thyromegaly.  Cardiovascular:     Rate and Rhythm: Normal rate and regular rhythm.     Heart sounds: Normal heart sounds. No murmur.  Pulmonary:     Effort: Pulmonary effort is normal. No respiratory distress.     Breath sounds: Normal breath sounds. No wheezing or rales.     Comments: Good air movement. Rare cough. No focal abnormal breath sound Musculoskeletal: Normal range of motion.  Lymphadenopathy:     Cervical: No cervical adenopathy.  Skin:    General: Skin is warm and dry.     Findings: No erythema or rash.  Neurological:     Mental Status: She is alert  and oriented to person, place, and time.  Psychiatric:        Behavior: Behavior normal.     Comments: Well groomed, good eye contact, normal speech and thoughts    Results for orders placed or performed during the hospital encounter of 04/19/18  Lipase, blood  Result Value Ref Range   Lipase 38 11 - 51 U/L  Comprehensive metabolic panel  Result Value Ref Range   Sodium 140 135 - 145 mmol/L   Potassium 3.2 (L) 3.5 - 5.1 mmol/L   Chloride 104 98 - 111 mmol/L   CO2 30 22 - 32 mmol/L   Glucose, Bld 114 (H) 70 - 99 mg/dL   BUN 12 8 - 23 mg/dL   Creatinine, Ser 0.65 0.44 - 1.00 mg/dL   Calcium 9.1 8.9 - 10.3 mg/dL   Total Protein 7.2 6.5 - 8.1 g/dL   Albumin 3.9 3.5 - 5.0 g/dL   AST 20 15 - 41 U/L   ALT 17 0 - 44 U/L   Alkaline Phosphatase 48 38 - 126 U/L   Total Bilirubin 0.8 0.3 - 1.2 mg/dL   GFR calc non  Af Amer >60 >60 mL/min   GFR calc Af Amer >60 >60 mL/min   Anion gap 6 5 - 15  CBC  Result Value Ref Range   WBC 4.4 4.0 - 10.5 K/uL   RBC 4.69 3.87 - 5.11 MIL/uL   Hemoglobin 12.6 12.0 - 15.0 g/dL   HCT 40.8 36.0 - 46.0 %   MCV 87.0 80.0 - 100.0 fL   MCH 26.9 26.0 - 34.0 pg   MCHC 30.9 30.0 - 36.0 g/dL   RDW 13.1 11.5 - 15.5 %   Platelets 186 150 - 400 K/uL   nRBC 0.0 0.0 - 0.2 %  Urinalysis, Complete w Microscopic  Result Value Ref Range   Color, Urine STRAW (A) YELLOW   APPearance CLEAR (A) CLEAR   Specific Gravity, Urine 1.012 1.005 - 1.030   pH 5.0 5.0 - 8.0   Glucose, UA NEGATIVE NEGATIVE mg/dL   Hgb urine dipstick SMALL (A) NEGATIVE   Bilirubin Urine NEGATIVE NEGATIVE   Ketones, ur NEGATIVE NEGATIVE mg/dL   Protein, ur NEGATIVE NEGATIVE mg/dL   Nitrite NEGATIVE NEGATIVE   Leukocytes, UA NEGATIVE NEGATIVE   RBC / HPF 0-5 0 - 5 RBC/hpf   WBC, UA 0-5 0 - 5 WBC/hpf   Bacteria, UA NONE SEEN NONE SEEN   Squamous Epithelial / LPF 0-5 0 - 5   Hyaline Casts, UA PRESENT       Assessment & Plan:   Problem List Items Addressed This Visit    None    Visit Diagnoses    Acute non-recurrent maxillary sinusitis    -  Primary   Relevant Medications   ipratropium (ATROVENT) 0.06 % nasal spray   benzonatate (TESSALON) 100 MG capsule   Dry cough       Relevant Medications   benzonatate (TESSALON) 100 MG capsule      Gradually improving Consistent with acute maxillary sinusitis, likely initially viral URI without evidence of bacterial sinusitis. Additionally with mostly dry non productive cough. Exposure to viral URI with grandchildren, no other sick contacts or high risk exposure.  Plan: 1. Reassurance, likely self-limited - no indication for antibiotics at this time 2. Start Atrovent nasal spray decongestant 2 sprays in each nostril up to 4 times daily for 7 days 3. Start Tessalon Perls take 1 capsule up to 3 times a day  as needed for cough 4. May try OTC Mucinex up to  7-10 days then stop 5. Improve hydration, other OTC meds as advised, stop Delsym, may stop Coricidin if take other med  Return criteria reviewed - may consider an antibiotic if indicated after next 48-72 hours if not improved or new symptoms of sinusitis   Meds ordered this encounter  Medications  . ipratropium (ATROVENT) 0.06 % nasal spray    Sig: Place 2 sprays into both nostrils 4 (four) times daily. For up to 5-7 days then stop.    Dispense:  15 mL    Refill:  0  . benzonatate (TESSALON) 100 MG capsule    Sig: Take 1 capsule (100 mg total) by mouth 3 (three) times daily as needed for cough.    Dispense:  30 capsule    Refill:  0     Follow up plan: Return in about 1 week (around 06/07/2018), or if symptoms worsen or fail to improve, for sinusitis.  Re order blood test for yearly medicare checkup within 4 weeks  Nobie Putnam, [facility] 05/31/2018, 9:40 AM

## 2018-06-08 ENCOUNTER — Ambulatory Visit: Payer: Medicare Other | Admitting: Family Medicine

## 2018-06-08 ENCOUNTER — Telehealth: Payer: Self-pay | Admitting: Family Medicine

## 2018-06-08 DIAGNOSIS — R059 Cough, unspecified: Secondary | ICD-10-CM

## 2018-06-08 DIAGNOSIS — R05 Cough: Secondary | ICD-10-CM

## 2018-06-08 DIAGNOSIS — J0100 Acute maxillary sinusitis, unspecified: Secondary | ICD-10-CM

## 2018-06-08 MED ORDER — GUAIFENESIN-CODEINE 100-10 MG/5ML PO SYRP: 5.0000 mL | ORAL_SOLUTION | Freq: Four times a day (QID) | ORAL | 0 refills | Status: DC | PRN

## 2018-06-08 NOTE — Telephone Encounter (Signed)
Received e-script error, said transmission failed, however I called pharmacy [phone] and they confirmed they received it for Virtussin and already filled and patient picked up. No further action required.  Nobie Putnam, DO [facility] 06/08/2018, 4:59 PM

## 2018-06-08 NOTE — Telephone Encounter (Signed)
Called patient to triage her symptoms after scheduling today for Cough (chief complaint) at 11:20am.  I saw patient on 05/31/18 about 1 week ago for Sinusitis. She was treated for afebrile mild sinusitis with nasal decongestant atrovent and tessalon perls for cough. Determined to not be bacterial sinusitis, and also no symptoms of high risk travel or known exposure to warrant other testing at that time based on guidelines currently as of 05/31/18.  Now patient has rescheduled to see me today 3/17. I called her this morning in triage, her symptoms now have IMPROVED, she has reduced sinus symptoms, no longer using nasal spray, she remains AFEBRILE still, and now main complaint is dry cough that persist at night, otherwise limited or no cough during day, she has never had inhaler or COPD or asthma, she does not endorse wheezing or dyspnea. She has non productive cough. She did go out in public to stores in past week, but no known sick contact. Still no other travel history.  I advised her that her symptoms are low risk overall. Do not seem to meet criteria for bacterial sinus infection since improving now, and also since afebrile, does not meet COVID19 testing or screening criteria, but still raises the concern of closer watch.  I advised her to cancel apt today to our office. I agree to phone in stronger cough syrup for night-time use PRN, sent to [facility], Virtussin, and she may use Coricidin cough med OTC during day, she should SELF QUARANTINE at home after get medicine, and avoid contact with others for 48 hours minimum - if she is improving and still afebrile, then continue treatment with improvement. If NOT improved or any worsening - we may advise that she proceed with going to [place] site.  Brittney Schmidt, [facility] 06/08/2018, 8:56 AM

## 2018-06-17 DIAGNOSIS — M2042 Other hammer toe(s) (acquired), left foot: Secondary | ICD-10-CM | POA: Diagnosis not present

## 2018-06-17 DIAGNOSIS — M779 Enthesopathy, unspecified: Secondary | ICD-10-CM | POA: Diagnosis not present

## 2018-06-17 DIAGNOSIS — G5762 Lesion of plantar nerve, left lower limb: Secondary | ICD-10-CM | POA: Diagnosis not present

## 2018-06-17 DIAGNOSIS — Q6689 Other  specified congenital deformities of feet: Secondary | ICD-10-CM | POA: Diagnosis not present

## 2018-08-05 ENCOUNTER — Other Ambulatory Visit: Payer: Self-pay | Admitting: Family Medicine

## 2018-08-05 DIAGNOSIS — Z1231 Encounter for screening mammogram for malignant neoplasm of breast: Secondary | ICD-10-CM

## 2018-09-06 ENCOUNTER — Ambulatory Visit
Admission: RE | Admit: 2018-09-06 | Discharge: 2018-09-06 | Disposition: A | Discharge status: Home / Self Care | Payer: Medicare Other | Source: Ambulatory Visit | Attending: Family Medicine | Admitting: Family Medicine

## 2018-09-06 ENCOUNTER — Other Ambulatory Visit: Payer: Self-pay

## 2018-09-06 DIAGNOSIS — Z1231 Encounter for screening mammogram for malignant neoplasm of breast: Secondary | ICD-10-CM | POA: Insufficient documentation

## 2018-09-08 DIAGNOSIS — Z79899 Other long term (current) drug therapy: Secondary | ICD-10-CM | POA: Diagnosis not present

## 2018-09-08 DIAGNOSIS — R202 Paresthesia of skin: Secondary | ICD-10-CM | POA: Diagnosis not present

## 2018-09-08 DIAGNOSIS — H539 Unspecified visual disturbance: Secondary | ICD-10-CM | POA: Diagnosis not present

## 2018-09-08 DIAGNOSIS — H538 Other visual disturbances: Secondary | ICD-10-CM | POA: Diagnosis not present

## 2018-09-08 DIAGNOSIS — R51 Headache: Secondary | ICD-10-CM | POA: Diagnosis not present

## 2018-09-08 DIAGNOSIS — R200 Anesthesia of skin: Secondary | ICD-10-CM | POA: Diagnosis not present

## 2018-09-08 DIAGNOSIS — I10 Essential (primary) hypertension: Secondary | ICD-10-CM | POA: Diagnosis not present

## 2018-10-06 DIAGNOSIS — M779 Enthesopathy, unspecified: Secondary | ICD-10-CM | POA: Diagnosis not present

## 2018-10-06 DIAGNOSIS — Q6689 Other  specified congenital deformities of feet: Secondary | ICD-10-CM | POA: Diagnosis not present

## 2018-11-16 ENCOUNTER — Ambulatory Visit (INDEPENDENT_AMBULATORY_CARE_PROVIDER_SITE_OTHER): Payer: Medicare Other

## 2018-11-16 ENCOUNTER — Other Ambulatory Visit: Payer: Self-pay

## 2018-11-16 VITALS — BP 128/69 | HR 70 | Temp 98.5°F | Resp 16 | Ht 62.0 in | Wt 168.0 lb

## 2018-11-16 DIAGNOSIS — Z23 Encounter for immunization: Secondary | ICD-10-CM

## 2018-11-16 DIAGNOSIS — Z0000 Encounter for general adult medical examination without abnormal findings: Secondary | ICD-10-CM | POA: Diagnosis not present

## 2018-11-16 DIAGNOSIS — Z1211 Encounter for screening for malignant neoplasm of colon: Secondary | ICD-10-CM

## 2018-11-16 NOTE — Patient Instructions (Signed)
Brittney Schmidt , Thank you for taking time to come for your Medicare Wellness Visit. I appreciate your ongoing commitment to your health goals. Please review the following plan we discussed and let me know if I can assist you in the future.   Screening recommendations/referrals: Colonoscopy: referral sent to GI, someone will call you  Mammogram: completed 08/2018 Bone Density: declined, will order next year  Recommended yearly ophthalmology/optometry visit for glaucoma screening and checkup Recommended yearly dental visit for hygiene and checkup  Vaccinations: Influenza vaccine: due 11/2018 Pneumococcal vaccine: done today  Tdap vaccine: up to date Shingles vaccine: shingrix eligible   Advanced directives: Advance directive discussed with you today. I have provided a copy for you to complete at home and have notarized. Once this is complete please bring a copy in to our office so we can scan it into your chart.  Conditions/risks identified: continue doing your crossword puzzles to keep your brain active.   Next appointment: Follow up in one year for your annual wellness visit    Preventive Care 65 Years and Older, Female Preventive care refers to lifestyle choices and visits with your health care provider that can promote health and wellness. What does preventive care include?  A yearly physical exam. This is also called an annual well check.  Dental exams once or twice a year.  Routine eye exams. Ask your health care provider how often you should have your eyes checked.  Personal lifestyle choices, including:  Daily care of your teeth and gums.  Regular physical activity.  Eating a healthy diet.  Avoiding tobacco and drug use.  Limiting alcohol use.  Practicing safe sex.  Taking low-dose aspirin every day.  Taking vitamin and mineral supplements as recommended by your health care provider. What happens during an annual well check? The services and screenings done by  your health care provider during your annual well check will depend on your age, overall health, lifestyle risk factors, and family history of disease. Counseling  Your health care provider may ask you questions about your:  Alcohol use.  Tobacco use.  Drug use.  Emotional well-being.  Home and relationship well-being.  Sexual activity.  Eating habits.  History of falls.  Memory and ability to understand (cognition).  Work and work Statistician.  Reproductive health. Screening  You may have the following tests or measurements:  Height, weight, and BMI.  Blood pressure.  Lipid and cholesterol levels. These may be checked every 5 years, or more frequently if you are over 1 years old.  Skin check.  Lung cancer screening. You may have this screening every year starting at age 65 if you have a 30-pack-year history of smoking and currently smoke or have quit within the past 15 years.  Fecal occult blood test (FOBT) of the stool. You may have this test every year starting at age 29.  Flexible sigmoidoscopy or colonoscopy. You may have a sigmoidoscopy every 5 years or a colonoscopy every 10 years starting at age 48.  Hepatitis C blood test.  Hepatitis B blood test.  Sexually transmitted disease (STD) testing.  Diabetes screening. This is done by checking your blood sugar (glucose) after you have not eaten for a while (fasting). You may have this done every 1-3 years.  Bone density scan. This is done to screen for osteoporosis. You may have this done starting at age 53.  Mammogram. This may be done every 1-2 years. Talk to your health care provider about how often you should  have regular mammograms. Talk with your health care provider about your test results, treatment options, and if necessary, the need for more tests. Vaccines  Your health care provider may recommend certain vaccines, such as:  Influenza vaccine. This is recommended every year.  Tetanus,  diphtheria, and acellular pertussis (Tdap, Td) vaccine. You may need a Td booster every 10 years.  Zoster vaccine. You may need this after age 34.  Pneumococcal 13-valent conjugate (PCV13) vaccine. One dose is recommended after age 30.  Pneumococcal polysaccharide (PPSV23) vaccine. One dose is recommended after age 55. Talk to your health care provider about which screenings and vaccines you need and how often you need them. This information is not intended to replace advice given to you by your health care provider. Make sure you discuss any questions you have with your health care provider. Document Released: 04/06/2015 Document Revised: 11/28/2015 Document Reviewed: 01/09/2015 Elsevier Interactive Patient Education  2017 [place] Prevention in the Home Falls can cause injuries. They can happen to people of all ages. There are many things you can do to make your home safe and to help prevent falls. What can I do on the outside of my home?  Regularly fix the edges of walkways and driveways and fix any cracks.  Remove anything that might make you trip as you walk through a door, such as a raised step or threshold.  Trim any bushes or trees on the path to your home.  Use bright outdoor lighting.  Clear any walking paths of anything that might make someone trip, such as rocks or tools.  Regularly check to see if handrails are loose or broken. Make sure that both sides of any steps have handrails.  Any raised decks and porches should have guardrails on the edges.  Have any leaves, snow, or ice cleared regularly.  Use sand or salt on walking paths during winter.  Clean up any spills in your garage right away. This includes oil or grease spills. What can I do in the bathroom?  Use night lights.  Install grab bars by the toilet and in the tub and shower. Do not use towel bars as grab bars.  Use non-skid mats or decals in the tub or shower.  If you need to sit down in  the shower, use a plastic, non-slip stool.  Keep the floor dry. Clean up any water that spills on the floor as soon as it happens.  Remove soap buildup in the tub or shower regularly.  Attach bath mats securely with double-sided non-slip rug tape.  Do not have throw rugs and other things on the floor that can make you trip. What can I do in the bedroom?  Use night lights.  Make sure that you have a light by your bed that is easy to reach.  Do not use any sheets or blankets that are too big for your bed. They should not hang down onto the floor.  Have a firm chair that has side arms. You can use this for support while you get dressed.  Do not have throw rugs and other things on the floor that can make you trip. What can I do in the kitchen?  Clean up any spills right away.  Avoid walking on wet floors.  Keep items that you use a lot in easy-to-reach places.  If you need to reach something above you, use a strong step stool that has a grab bar.  Keep electrical cords out of  the way.  Do not use floor polish or wax that makes floors slippery. If you must use wax, use non-skid floor wax.  Do not have throw rugs and other things on the floor that can make you trip. What can I do with my stairs?  Do not leave any items on the stairs.  Make sure that there are handrails on both sides of the stairs and use them. Fix handrails that are broken or loose. Make sure that handrails are as long as the stairways.  Check any carpeting to make sure that it is firmly attached to the stairs. Fix any carpet that is loose or worn.  Avoid having throw rugs at the top or bottom of the stairs. If you do have throw rugs, attach them to the floor with carpet tape.  Make sure that you have a light switch at the top of the stairs and the bottom of the stairs. If you do not have them, ask someone to add them for you. What else can I do to help prevent falls?  Wear shoes that:  Do not have high  heels.  Have rubber bottoms.  Are comfortable and fit you well.  Are closed at the toe. Do not wear sandals.  If you use a stepladder:  Make sure that it is fully opened. Do not climb a closed stepladder.  Make sure that both sides of the stepladder are locked into place.  Ask someone to hold it for you, if possible.  Clearly mark and make sure that you can see:  Any grab bars or handrails.  First and last steps.  Where the edge of each step is.  Use tools that help you move around (mobility aids) if they are needed. These include:  Canes.  Walkers.  Scooters.  Crutches.  Turn on the lights when you go into a dark area. Replace any light bulbs as soon as they burn out.  Set up your furniture so you have a clear path. Avoid moving your furniture around.  If any of your floors are uneven, fix them.  If there are any pets around you, be aware of where they are.  Review your medicines with your doctor. Some medicines can make you feel dizzy. This can increase your chance of falling. Ask your doctor what other things that you can do to help prevent falls. This information is not intended to replace advice given to you by your health care provider. Make sure you discuss any questions you have with your health care provider. Document Released: 01/04/2009 Document Revised: 08/16/2015 Document Reviewed: 04/14/2014 Elsevier Interactive Patient Education  2017 [employer].

## 2018-11-16 NOTE — Progress Notes (Signed)
Subjective:   Brittney Schmidt is a 68 y.o. female who presents for an Initial Medicare Annual Wellness Visit.  Review of Systems      Cardiac Risk Factors include: advanced age (>42men, >85 women);obesity (BMI >30kg/m2)     Objective:    Today's Vitals   11/16/18 1010  BP: 128/69  Pulse: 70  Resp: 16  Temp: 98.5 F (36.9 C)  TempSrc: Oral  Weight: 168 lb (76.2 kg)  Height: 5\' 2"  (1.575 m)   Body mass index is 30.73 kg/m.  Advanced Directives 11/16/2018 04/19/2018 10/28/2017 04/03/2017  Does Patient Have a Medical Advance Directive? No No Yes No  Would patient like information on creating a medical advance directive? - - - No - Patient declined    Current Medications (verified) Outpatient Encounter Medications as of 11/16/2018  Medication Sig  . aspirin 81 MG tablet Take 81 mg by mouth daily.  Marland Kitchen ibuprofen (ADVIL,MOTRIN) 200 MG tablet Take 200 mg by mouth every 6 (six) hours as needed.  Marland Kitchen lisinopril-hydrochlorothiazide (PRINZIDE,ZESTORETIC) 20-25 MG tablet TAKE 1 TABLET BY MOUTH ONCE DAILY  . fluticasone (FLONASE) 50 MCG/ACT nasal spray USE 2 SPRAY(S) IN EACH NOSTRIL ONCE DAILY  . [DISCONTINUED] benzonatate (TESSALON) 100 MG capsule Take 1 capsule (100 mg total) by mouth 3 (three) times daily as needed for cough. (Patient not taking: Reported on 11/16/2018)  . [DISCONTINUED] guaiFENesin-codeine (ROBITUSSIN AC) 100-10 MG/5ML syrup Take 5-10 mLs by mouth 4 (four) times daily as needed for cough. (Patient not taking: Reported on 11/16/2018)  . [DISCONTINUED] ipratropium (ATROVENT) 0.06 % nasal spray Place 2 sprays into both nostrils 4 (four) times daily. For up to 5-7 days then stop. (Patient not taking: Reported on 11/16/2018)  . [DISCONTINUED] omeprazole (PRILOSEC) 40 MG capsule Take 1 capsule (40 mg total) by mouth daily. (Patient not taking: Reported on 11/16/2018)   No facility-administered encounter medications on file as of 11/16/2018.     Allergies (verified) Patient has no  known allergies.   History: Past Medical History:  Diagnosis Date  . Ankle swelling   . Hypertension    Past Surgical History:  Procedure Laterality Date  . TUBAL LIGATION     Family History  Problem Relation Age of Onset  . Diabetes Mother   . Hypertension Mother   . Colon polyps Father   . Heart disease Father   . Heart attack Father   . Cancer Brother   . Hypertension Son   . Breast cancer Neg Hx   . Colon cancer Neg Hx    Social History   Socioeconomic History  . Marital status: Single    Spouse name: Not on file  . Number of children: 3  . Years of education: GED  . Highest education level: Not on file  Occupational History  . 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 Financial resource strain: Not hard at all  . Food insecurity    Worry: Never true    Inability: Never true  . Transportation needs    Medical: No    Non-medical: No  Tobacco Use  . Smoking status: Never Smoker  . Smokeless tobacco: Never Used  Substance and Sexual Activity  . Alcohol use: Yes    Alcohol/week: 2.0 standard drinks    Types: 2 Cans of beer per week    Comment: occasional, planning to quit completely  . Drug use: No  . Sexual activity: Not Currently    Partners: Male  Lifestyle  .  Physical activity    Days per week: 0 days    Minutes per session: 0 min  . Stress: Not at all  Relationships  . Social connections    Talks on phone: More than three times a week    Gets together: More than three times a week    Attends religious service: More than 4 times per year    Active member of club or organization: No    Attends meetings of clubs or organizations: Never    Relationship status: Never married  Other Topics Concern  . Not on file  Social History Narrative   Has family that lives close by    Tobacco Counseling Counseling given: Not Answered   Clinical Intake:  Pre-visit preparation completed: Yes  Pain : No/denies pain      Nutritional Status: BMI > 30  Obese Nutritional Risks: None Diabetes: No  How often do you need to have someone help you when you read instructions, pamphlets, or other written materials from your doctor or pharmacy?: 1 - Never  Interpreter Needed?: No  Information entered by :: Tiffany Hill,LPN   Activities of Daily Living In your present state of health, do you have any difficulty performing the following activities: 11/16/2018  Hearing? N  Comment no hearing aids  Vision? N  Comment eyeglasses, [facility]  Difficulty concentrating or making decisions? N  Walking or climbing stairs? N  Dressing or bathing? N  Doing errands, shopping? N  Comment using sons car currently  Preparing Food and eating ? N  Using the Toilet? N  In the past six months, have you accidently leaked urine? N  Do you have problems with loss of bowel control? N  Managing your Medications? N  Managing your Finances? N  Housekeeping or managing your Housekeeping? N  Some recent data might be hidden     Immunizations and Health Maintenance Immunization History  Administered Date(s) Administered  . Influenza-Unspecified 04/14/2018  . Pneumococcal Conjugate-13 10/28/2017  . Pneumococcal Polysaccharide-23 11/16/2018  . Tdap 10/17/2015   Health Maintenance Due  Topic Date Due  . Hepatitis C Screening  [birth year]  . COLONOSCOPY  10/22/2002  . INFLUENZA VACCINE  10/23/2018  . PNA vac Low Risk Adult (2 of 2 - PPSV23) 10/29/2018    Patient Care Team: Olin Hauser, DO as PCP - General (Family Medicine) Rico Junker, RN as Registered Nurse Theodore Demark, RN as Registered Nurse  Indicate any recent Medical Services you may have received from other than [facility] providers in the past year (date may be approximate).     Assessment:   This is a routine wellness examination for [name].  Hearing/Vision screen  Hearing Screening   125Hz  250Hz  500Hz  1000Hz  2000Hz  3000Hz  4000Hz   6000Hz  8000Hz   Right ear:           Left ear:           Vision Screening Comments: [facility]   Dietary issues and exercise activities discussed: Current Exercise Habits: The patient does not participate in regular exercise at present, Exercise limited by: None identified  Goals    . Weight (lb) < 200 lb (90.7 kg)     Wants to be at 150 pounds in 3 months.      Depression Screen PHQ 2/9 Scores 11/16/2018 05/31/2018 10/28/2017 09/22/2017 06/04/2016 06/14/2015  PHQ - 2 Score 0 0 0 0 0 0    Fall Risk Fall Risk  11/16/2018 05/31/2018  10/28/2017 09/22/2017 06/04/2016  Falls in the past year? 0 0 No No Yes  Number falls in past yr: - - - - 2 or more  Injury with Fall? - - - - No  Risk for fall due to : - - - - Other (Comment)  Risk for fall due to: Comment - - - - wet floor at home, improper shoes on ladder.  Follow up - Falls evaluation completed - - -   FALL RISK PREVENTION PERTAINING TO THE HOME:  Any stairs in or around the home? No  If so, are there any without handrails? n/a  Home free of loose throw rugs in walkways, pet beds, electrical cords, etc? Yes  Adequate lighting in your home to reduce risk of falls? Yes   ASSISTIVE DEVICES UTILIZED TO PREVENT FALLS:  Life alert? No  Use of a cane, walker or w/c? No  Grab bars in the bathroom? No  Shower chair or bench in shower? No  Elevated toilet seat or a handicapped toilet? No    DME ORDERS:  DME order needed?  No   TIMED UP AND GO:  Was the test performed? Yes .  Length of time to ambulate 10 feet: 10 sec.   GAIT:  Appearance of gait: Gait steady and fast with/without the use of an assistive device.   Education: Fall risk prevention has been discussed.  Intervention(s) required? No   DME/home health order needed?  No     Cognitive Function: MMSE - Mini Mental State Exam 10/28/2017  Orientation to time 5  Orientation to Place 5  Registration 3  Attention/ Calculation 5  Recall 1  Language- name 2  objects 2  Language- repeat 1  Language- follow 3 step command 3  Language- read & follow direction 1  Write a sentence 1  Copy design 1  Total score 28     6CIT Screen 11/16/2018  What Year? 0 points  What month? 0 points  What time? 0 points  Count back from 20 0 points  Months in reverse 0 points  Repeat phrase 0 points  Total Score 0    Screening Tests Health Maintenance  Topic Date Due  . Hepatitis C Screening  1950-10-03  . COLONOSCOPY  10/22/2002  . INFLUENZA VACCINE  10/23/2018  . PNA vac Low Risk Adult (2 of 2 - PPSV23) 10/29/2018  . DEXA SCAN  11/16/2019 (Originally 10/21/2017)  . MAMMOGRAM  09/05/2020  . TETANUS/TDAP  12/22/2025    Qualifies for Shingles Vaccine? Yes  Zostavax completed n/a. Due for Shingrix. Education has been provided regarding the importance of this vaccine. Pt has been advised to call insurance company to determine out of pocket expense. Advised may also receive vaccine at local pharmacy or Health Dept. Verbalized acceptance and understanding.  Tdap: up to date   Flu Vaccine: due 11/2018   Pneumococcal Vaccine: up to date  Cancer Screenings:  Colorectal Screening: referral sent GI   Mammogram: Completed 09/06/2018. Repeat every year   Bone Density: declined, will order with next mammogram  Lung Cancer Screening: (Low Dose CT Chest recommended if Age 56-80 years, 30 pack-year currently smoking OR have quit w/in 15years.) does not qualify.   Additional Screening:  Hepatitis C Screening: does qualify  Vision Screening: Recommended annual ophthalmology exams for early detection of glaucoma and other disorders of the eye. Is the patient up to date with their annual eye exam?  Yes  Who is the provider or what is the name  of the office in which the pt attends annual eye exams? [facility]  Dental Screening: Recommended annual dental exams for proper oral hygiene  Community Resource Referral:  CRR required this visit?  No        Plan:  I have personally reviewed and addressed the Medicare Annual Wellness questionnaire and have noted the following in the patient's chart:  A. Medical and social history B. Use of alcohol, tobacco or illicit drugs  C. Current medications and supplements D. Functional ability and status E.  Nutritional status F.  Physical activity G. Advance directives H. List of other physicians I.  Hospitalizations, surgeries, and ER visits in previous 12 months J.  [place] such as hearing and vision if needed, cognitive and depression L. Referrals and appointments   In addition, I have reviewed and discussed with patient certain preventive protocols, quality metrics, and best practice recommendations. A written personalized care plan for preventive services as well as general preventive health recommendations were provided to patient.   Signed,    Bevelyn Ngo, LPN   [id number]  Nurse Health Advisor   Nurse Notes: none

## 2018-11-30 ENCOUNTER — Encounter: Payer: Self-pay | Admitting: *Deleted

## 2018-12-07 ENCOUNTER — Encounter: Payer: Self-pay | Admitting: Family Medicine

## 2018-12-07 ENCOUNTER — Other Ambulatory Visit: Payer: Self-pay

## 2018-12-07 ENCOUNTER — Ambulatory Visit (INDEPENDENT_AMBULATORY_CARE_PROVIDER_SITE_OTHER): Payer: Medicare Other | Admitting: Family Medicine

## 2018-12-07 VITALS — BP 116/78 | HR 76 | Ht 62.0 in | Wt 172.0 lb

## 2018-12-07 DIAGNOSIS — I10 Essential (primary) hypertension: Secondary | ICD-10-CM

## 2018-12-07 DIAGNOSIS — R7309 Other abnormal glucose: Secondary | ICD-10-CM | POA: Diagnosis not present

## 2018-12-07 DIAGNOSIS — Z23 Encounter for immunization: Secondary | ICD-10-CM | POA: Diagnosis not present

## 2018-12-07 DIAGNOSIS — Z1211 Encounter for screening for malignant neoplasm of colon: Secondary | ICD-10-CM

## 2018-12-07 DIAGNOSIS — Z1159 Encounter for screening for other viral diseases: Secondary | ICD-10-CM

## 2018-12-07 DIAGNOSIS — E669 Obesity, unspecified: Secondary | ICD-10-CM | POA: Diagnosis not present

## 2018-12-07 MED ORDER — LISINOPRIL-HYDROCHLOROTHIAZIDE 20-25 MG PO TABS: 1.0000 | ORAL_TABLET | Freq: Every day | ORAL | 3 refills | Status: DC

## 2018-12-07 NOTE — Patient Instructions (Addendum)
Thank you for coming to the office today.   Referral for Colonoscopy - stay tuned for apt, call if you need.  [facility]) [address] [place], [place] [address] Phone: [phone]  Refilled BP medication.  DUE for FASTING BLOOD WORK (no food or drink after midnight before the lab appointment, only water or coffee without cream/sugar on the morning of)  SCHEDULE "Lab Only" visit in the morning at the clinic for lab draw in 1 WEEK  - Make sure Lab Only appointment is at about 1 week before your next appointment, so that results will be available  For Lab Results, once available within 2-3 days of blood draw, you can can log in to MyChart online to view your results and a brief explanation. Also, we can discuss results at next follow-up visit.   Please schedule a Follow-up Appointment to: Return in about 6 months (around 06/06/2019) for 6 month follow-up HTN.  If you have any other questions or concerns, please feel free to call the office or send a message through [place]. You may also schedule an earlier appointment if necessary.  Additionally, you may be receiving a survey about your experience at our office within a few days to 1 week by e-mail or mail. We value your feedback.  Nobie Putnam, DO [facility]

## 2018-12-07 NOTE — Assessment & Plan Note (Signed)
Well-controlled HTN - Home BP readings none  No known complications    Plan:  1. Continue current BP regimen - Lisinopril 20-25mg  daily 2. Encourage improved lifestyle - low sodium diet, regular exercise 3. Continue monitor BP outside office, bring readings to next visit, if persistently >140/90 or new symptoms notify office sooner  Labs 1 week

## 2018-12-07 NOTE — Progress Notes (Signed)
Subjective:    Patient ID: Brittney Schmidt, female    DOB: 01-Mar-1951, 68 y.o.   MRN: [id number]  Brittney Schmidt is a 67 y.o. female presenting on 12/07/2018 for Hypertension (Pt does have medicare insurance. High dose flu shot. )   HPI   CHRONIC HTN / Obesity BMI >31 Reports no new concern. Needs refill on med. Not checking BP at home. Current Meds - Lisinopril-HCTZ 20-25mg  daily   Reports good compliance, took meds today. Tolerating well, w/o complaints. Lifestyle: - She is active, cleaning, shopping. Caring for grandchildren at times. - Diet: balanced, not particular diet, limits salt - Exercise: no regular exercise but plans to get to gym eventually Denies CP, dyspnea, HA, edema, dizziness / lightheadedness  GERD Managed on diet. Not on medication   Health Maintenance: Due for Flu Shot, will receive today   Colon CA Screening: Never had colonoscopy or screening test - Currently asymptomatic. No known family history of colon CA. Due for screening test new referral to GI for colonoscopy   Due for routine Hep C screen   Depression screen [facility] 2/9 12/07/2018 11/16/2018 05/31/2018  Decreased Interest 0 0 0  Down, Depressed, Hopeless 1 0 0  PHQ - 2 Score 1 0 0  Altered sleeping (No Data) - -  Difficult doing work/chores Not difficult at all - -    Past Medical History:  Diagnosis Date  . Ankle swelling   . Hypertension    Past Surgical History:  Procedure Laterality Date  . TUBAL LIGATION     Social History   Socioeconomic History  . Marital status: Single    Spouse name: Not on file  . Number of children: 3  . Years of education: GED  . Highest education level: Not on file  Occupational History  . 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 Financial resource strain: Not hard at all  . Food insecurity    Worry: Never true    Inability: Never true  . Transportation needs    Medical: No    Non-medical: No  Tobacco Use  . Smoking  status: Never Smoker  . Smokeless tobacco: Never Used  Substance and Sexual Activity  . Alcohol use: Yes    Alcohol/week: 2.0 standard drinks    Types: 2 Cans of beer per week    Comment: occasional, planning to quit completely  . Drug use: No  . Sexual activity: Not Currently    Partners: Male  Lifestyle  . Physical activity    Days per week: 0 days    Minutes per session: 0 min  . Stress: Not at all  Relationships  . Social connections    Talks on phone: More than three times a week    Gets together: More than three times a week    Attends religious service: More than 4 times per year    Active member of club or organization: No    Attends meetings of clubs or organizations: Never    Relationship status: Never married  . Intimate partner violence    Fear of current or ex partner: No    Emotionally abused: No    Physically abused: No    Forced sexual activity: No  Other Topics Concern  . Not on file  Social History Narrative   Has family that lives close by   Family History  Problem Relation Age of Onset  . Diabetes Mother   . Hypertension Mother   .  Colon polyps Father   . Heart disease Father   . Heart attack Father   . Cancer Brother   . Hypertension Son   . Breast cancer Neg Hx   . Colon cancer Neg Hx    Current Outpatient Medications on File Prior to Visit  Medication Sig  . aspirin 81 MG tablet Take 81 mg by mouth daily.  . fluticasone (FLONASE) 50 MCG/ACT nasal spray USE 2 SPRAY(S) IN EACH NOSTRIL ONCE DAILY  . ibuprofen (ADVIL,MOTRIN) 200 MG tablet Take 200 mg by mouth every 6 (six) hours as needed.   No current facility-administered medications on file prior to visit.     Review of Systems  Constitutional: Negative for activity change, appetite change, chills, diaphoresis, fatigue and fever.  HENT: Negative for congestion and hearing loss.   Eyes: Negative for visual disturbance.  Respiratory: Negative for apnea, cough, chest tightness, shortness of  breath and wheezing.   Cardiovascular: Negative for chest pain, palpitations and leg swelling.  Gastrointestinal: Negative for abdominal pain, anal bleeding, blood in stool, constipation, diarrhea, nausea and vomiting.  Endocrine: Negative for cold intolerance.  Genitourinary: Negative for difficulty urinating, dysuria, frequency and hematuria.  Musculoskeletal: Negative for arthralgias, back pain and neck pain.  Skin: Negative for rash.  Allergic/Immunologic: Negative for environmental allergies.  Neurological: Negative for dizziness, weakness, light-headedness, numbness and headaches.  Hematological: Negative for adenopathy.  Psychiatric/Behavioral: Negative for behavioral problems, dysphoric mood and sleep disturbance. The patient is not nervous/anxious.    Per HPI unless specifically indicated above     Objective:    BP 116/78   Pulse 76   Ht 5\' 2"  (1.575 m)   Wt 172 lb (78 kg)   SpO2 99%   BMI 31.46 kg/m   Wt Readings from Last 3 Encounters:  12/07/18 172 lb (78 kg)  11/16/18 168 lb (76.2 kg)  05/31/18 164 lb (74.4 kg)    Physical Exam Vitals signs and nursing note reviewed.  Constitutional:      General: She is not in acute distress.    Appearance: She is well-developed. She is not diaphoretic.     Comments: Well-appearing, comfortable, cooperative  HENT:     Head: Normocephalic and atraumatic.  Eyes:     General:        Right eye: No discharge.        Left eye: No discharge.     Conjunctiva/sclera: Conjunctivae normal.     Pupils: Pupils are equal, round, and reactive to light.  Neck:     Musculoskeletal: Normal range of motion and neck supple.     Thyroid: No thyromegaly.  Cardiovascular:     Rate and Rhythm: Normal rate and regular rhythm.     Heart sounds: Normal heart sounds. No murmur.  Pulmonary:     Effort: Pulmonary effort is normal. No respiratory distress.     Breath sounds: Normal breath sounds. No wheezing or rales.  Abdominal:     General:  Bowel sounds are normal. There is no distension.     Palpations: Abdomen is soft. There is no mass.     Tenderness: There is no abdominal tenderness.  Musculoskeletal: Normal range of motion.        General: No tenderness.     Comments: Upper / Lower Extremities: - Normal muscle tone, strength bilateral upper extremities 5/5, lower extremities 5/5  Lymphadenopathy:     Cervical: No cervical adenopathy.  Skin:    General: Skin is warm and dry.  Findings: No erythema or rash.  Neurological:     Mental Status: She is alert and oriented to person, place, and time.     Comments: Distal sensation intact to light touch all extremities  Psychiatric:        Behavior: Behavior normal.     Comments: Well groomed, good eye contact, normal speech and thoughts      Results for orders placed or performed during the hospital encounter of 04/19/18  Lipase, blood  Result Value Ref Range   Lipase 38 11 - 51 U/L  Comprehensive metabolic panel  Result Value Ref Range   Sodium 140 135 - 145 mmol/L   Potassium 3.2 (L) 3.5 - 5.1 mmol/L   Chloride 104 98 - 111 mmol/L   CO2 30 22 - 32 mmol/L   Glucose, Bld 114 (H) 70 - 99 mg/dL   BUN 12 8 - 23 mg/dL   Creatinine, Ser 0.65 0.44 - 1.00 mg/dL   Calcium 9.1 8.9 - 10.3 mg/dL   Total Protein 7.2 6.5 - 8.1 g/dL   Albumin 3.9 3.5 - 5.0 g/dL   AST 20 15 - 41 U/L   ALT 17 0 - 44 U/L   Alkaline Phosphatase 48 38 - 126 U/L   Total Bilirubin 0.8 0.3 - 1.2 mg/dL   GFR calc non Af Amer >60 >60 mL/min   GFR calc Af Amer >60 >60 mL/min   Anion gap 6 5 - 15  CBC  Result Value Ref Range   WBC 4.4 4.0 - 10.5 K/uL   RBC 4.69 3.87 - 5.11 MIL/uL   Hemoglobin 12.6 12.0 - 15.0 g/dL   HCT 40.8 36.0 - 46.0 %   MCV 87.0 80.0 - 100.0 fL   MCH 26.9 26.0 - 34.0 pg   MCHC 30.9 30.0 - 36.0 g/dL   RDW 13.1 11.5 - 15.5 %   Platelets 186 150 - 400 K/uL   nRBC 0.0 0.0 - 0.2 %  Urinalysis, Complete w Microscopic  Result Value Ref Range   Color, Urine STRAW (A) YELLOW    APPearance CLEAR (A) CLEAR   Specific Gravity, Urine 1.012 1.005 - 1.030   pH 5.0 5.0 - 8.0   Glucose, UA NEGATIVE NEGATIVE mg/dL   Hgb urine dipstick SMALL (A) NEGATIVE   Bilirubin Urine NEGATIVE NEGATIVE   Ketones, ur NEGATIVE NEGATIVE mg/dL   Protein, ur NEGATIVE NEGATIVE mg/dL   Nitrite NEGATIVE NEGATIVE   Leukocytes, UA NEGATIVE NEGATIVE   RBC / HPF 0-5 0 - 5 RBC/hpf   WBC, UA 0-5 0 - 5 WBC/hpf   Bacteria, UA NONE SEEN NONE SEEN   Squamous Epithelial / LPF 0-5 0 - 5   Hyaline Casts, UA PRESENT       Assessment & Plan:   Problem List Items Addressed This Visit    Essential hypertension - Primary    Well-controlled HTN - Home BP readings none  No known complications    Plan:  1. Continue current BP regimen - Lisinopril 20-25mg  daily 2. Encourage improved lifestyle - low sodium diet, regular exercise 3. Continue monitor BP outside office, bring readings to next visit, if persistently >140/90 or new symptoms notify office sooner  Labs 1 week      Relevant Medications   lisinopril-hydrochlorothiazide (ZESTORETIC) 20-25 MG tablet   Other Relevant Orders   CBC with Differential/Platelet   COMPLETE METABOLIC PANEL WITH GFR   Lipid panel   TSH   Obesity (BMI 30.0-34.9)   Relevant Orders  COMPLETE METABOLIC PANEL WITH GFR   Lipid panel   TSH    Other Visit Diagnoses    Abnormal glucose       Relevant Orders   Hemoglobin A1c   Needs flu shot       Relevant Orders   Flu Vaccine QUAD High Dose(Fluad) (Completed)   Screening for colon cancer       Relevant Orders   Ambulatory referral to Gastroenterology   Need for hepatitis C screening test       Relevant Orders   Hepatitis C antibody      Updated Health Maintenance information - Ordered Hep C screen routine, lab next week - Flu vaccine given - Ordered GI referral for colonoscopy, 1st one, routine screen, offered cologuard, she opted for colonoscopy, asymptomatic. Reviewed upcoming lab orders Encouraged  improvement to lifestyle with diet and exercise - Goal of weight loss   Orders Placed This Encounter  Procedures  . Flu Vaccine QUAD High Dose(Fluad)  . Hemoglobin A1c    Standing Status:   Future    Standing Expiration Date:   02/22/2019  . CBC with Differential/Platelet    Standing Status:   Future    Standing Expiration Date:   02/22/2019  . COMPLETE METABOLIC PANEL WITH GFR    Standing Status:   Future    Standing Expiration Date:   02/22/2019  . Lipid panel    Standing Status:   Future    Standing Expiration Date:   02/22/2019    Order Specific Question:   Has the patient fasted?    Answer:   Yes  . Hepatitis C antibody    Standing Status:   Future    Standing Expiration Date:   02/22/2019  . TSH    Standing Status:   Future    Standing Expiration Date:   02/22/2019  . Ambulatory referral to Gastroenterology    Referral Priority:   Routine    Referral Type:   Consultation    Referral Reason:   Specialty Services Required    Number of Visits Requested:   1    Meds ordered this encounter  Medications  . lisinopril-hydrochlorothiazide (ZESTORETIC) 20-25 MG tablet    Sig: Take 1 tablet by mouth daily.    Dispense:  90 tablet    Refill:  3    Follow up plan: Return in about 6 months (around 06/06/2019) for 6 month follow-up HTN.   Future fasting labs ordered for  1week, including add Hep C screen  Nobie Putnam, DO [facility] 12/07/2018, 9:44 AM

## 2018-12-16 DIAGNOSIS — Z20828 Contact with and (suspected) exposure to other viral communicable diseases: Secondary | ICD-10-CM | POA: Diagnosis not present

## 2018-12-17 ENCOUNTER — Telehealth: Payer: Self-pay | Admitting: Family Medicine

## 2018-12-17 ENCOUNTER — Encounter: Payer: Self-pay | Admitting: *Deleted

## 2018-12-17 NOTE — Telephone Encounter (Signed)
Patient was referred to [facility] for colonoscopy. She did not schedule with them, they were unable to reach her.  Please try to reach her as well. They mailed a letter.  If she would prefer we could order a Cologuard for her if she would like instead - if she did not want to schedule Colonoscopy.  Nobie Putnam, [facility] 12/17/2018, 10:35 AM

## 2018-12-17 NOTE — Telephone Encounter (Signed)
Patient prefers colonoscopy she will call them to schedule an appointement.

## 2018-12-29 ENCOUNTER — Other Ambulatory Visit: Payer: Self-pay

## 2018-12-29 ENCOUNTER — Telehealth: Payer: Self-pay

## 2018-12-29 DIAGNOSIS — Z1211 Encounter for screening for malignant neoplasm of colon: Secondary | ICD-10-CM

## 2018-12-29 NOTE — Telephone Encounter (Signed)
Gastroenterology Pre-Procedure Review  Brittney Schmidt Date: 10/27 Requesting Physician: Dr. Bonna Gains  PATIENT REVIEW QUESTIONS: The patient responded to the following health history questions as indicated:    1. Are you having any GI issues? no 2. Do you have a personal history of Polyps? no 3. Do you have a family history of Colon Cancer or Polyps? no 4. Diabetes Mellitus? no 5. Joint replacements in the past 12 months?no 6. Major health problems in the past 3 months?no 7. Any artificial heart valves, MVP, or defibrillator?no    MEDICATIONS & ALLERGIES:    Patient reports the following regarding taking any anticoagulation/antiplatelet therapy:   Plavix, Coumadin, Eliquis, Xarelto, Lovenox, Pradaxa, Brilinta, or Effient? no Aspirin? yes  Patient confirms/reports the following medications:  Current Outpatient Medications  Medication Sig Dispense Refill  . aspirin 81 MG tablet Take 81 mg by mouth daily.    . fluticasone (FLONASE) 50 MCG/ACT nasal spray USE 2 SPRAY(S) IN EACH NOSTRIL ONCE DAILY    . ibuprofen (ADVIL,MOTRIN) 200 MG tablet Take 200 mg by mouth every 6 (six) hours as needed.    Marland Kitchen lisinopril-hydrochlorothiazide (ZESTORETIC) 20-25 MG tablet Take 1 tablet by mouth daily. 90 tablet 3   No current facility-administered medications for this visit.     Patient confirms/reports the following allergies:  No Known Allergies  No orders of the defined types were placed in this encounter.   AUTHORIZATION INFORMATION Primary Insurance: 1D#: Group #:  Secondary Insurance: 1D#: Group #:  SCHEDULE INFORMATION: Date: 01/18/19 Time: Location:[facility]

## 2019-01-10 ENCOUNTER — Other Ambulatory Visit: Payer: Self-pay

## 2019-01-10 ENCOUNTER — Encounter: Payer: Self-pay | Admitting: *Deleted

## 2019-01-14 ENCOUNTER — Other Ambulatory Visit
Admission: RE | Admit: 2019-01-14 | Discharge: 2019-01-14 | Disposition: A | Discharge status: Home / Self Care | Payer: Medicare Other | Source: Ambulatory Visit | Attending: Gastroenterology | Admitting: Gastroenterology

## 2019-01-14 ENCOUNTER — Other Ambulatory Visit: Payer: Self-pay

## 2019-01-14 DIAGNOSIS — Z01812 Encounter for preprocedural laboratory examination: Secondary | ICD-10-CM | POA: Insufficient documentation

## 2019-01-14 DIAGNOSIS — Z20828 Contact with and (suspected) exposure to other viral communicable diseases: Secondary | ICD-10-CM | POA: Diagnosis not present

## 2019-01-15 LAB — SARS CORONAVIRUS 2 (TAT 6-24 HRS): SARS Coronavirus 2: NEGATIVE

## 2019-01-18 ENCOUNTER — Ambulatory Visit: Payer: Medicare Other | Admitting: Anesthesiology

## 2019-01-18 ENCOUNTER — Encounter
Admission: RE | Disposition: A | Discharge status: Home / Self Care | Payer: Self-pay | Source: Home / Self Care | Attending: Gastroenterology

## 2019-01-18 ENCOUNTER — Other Ambulatory Visit: Payer: Self-pay

## 2019-01-18 ENCOUNTER — Ambulatory Visit
Admission: RE | Admit: 2019-01-18 | Discharge: 2019-01-18 | Disposition: A | Discharge status: Home / Self Care | Payer: Medicare Other | Attending: Gastroenterology | Admitting: Gastroenterology

## 2019-01-18 DIAGNOSIS — K635 Polyp of colon: Secondary | ICD-10-CM

## 2019-01-18 DIAGNOSIS — Z79899 Other long term (current) drug therapy: Secondary | ICD-10-CM | POA: Diagnosis not present

## 2019-01-18 DIAGNOSIS — Z7982 Long term (current) use of aspirin: Secondary | ICD-10-CM | POA: Insufficient documentation

## 2019-01-18 DIAGNOSIS — Z800 Family history of malignant neoplasm of digestive organs: Secondary | ICD-10-CM | POA: Diagnosis not present

## 2019-01-18 DIAGNOSIS — Z1211 Encounter for screening for malignant neoplasm of colon: Secondary | ICD-10-CM | POA: Insufficient documentation

## 2019-01-18 DIAGNOSIS — D122 Benign neoplasm of ascending colon: Secondary | ICD-10-CM | POA: Diagnosis not present

## 2019-01-18 DIAGNOSIS — I10 Essential (primary) hypertension: Secondary | ICD-10-CM | POA: Diagnosis not present

## 2019-01-18 DIAGNOSIS — K5730 Diverticulosis of large intestine without perforation or abscess without bleeding: Secondary | ICD-10-CM | POA: Insufficient documentation

## 2019-01-18 HISTORY — PX: COLONOSCOPY WITH PROPOFOL: SHX5780

## 2019-01-18 HISTORY — PX: POLYPECTOMY: SHX5525

## 2019-01-18 HISTORY — DX: Presence of dental prosthetic device (complete) (partial): Z97.2

## 2019-01-18 SURGERY — COLONOSCOPY WITH PROPOFOL
Anesthesia: General | Site: Rectum

## 2019-01-18 MED ORDER — STERILE WATER FOR IRRIGATION IR SOLN
Status: DC | PRN
  Administered 2019-01-18: .05 mL

## 2019-01-18 MED ORDER — ONDANSETRON HCL 4 MG/2ML IJ SOLN: 4.0000 mg | Freq: Once | INTRAMUSCULAR | Status: DC | PRN

## 2019-01-18 MED ORDER — PROPOFOL 10 MG/ML IV BOLUS
INTRAVENOUS | Status: DC | PRN
  Administered 2019-01-18: 40 mg via INTRAVENOUS
  Administered 2019-01-18: 30 mg via INTRAVENOUS
  Administered 2019-01-18: 60 mg via INTRAVENOUS
  Administered 2019-01-18: 40 mg via INTRAVENOUS

## 2019-01-18 MED ORDER — LACTATED RINGERS IV SOLN
INTRAVENOUS | Status: DC
  Administered 2019-01-18: 10:00:00 via INTRAVENOUS

## 2019-01-18 MED ORDER — LIDOCAINE HCL (CARDIAC) PF 100 MG/5ML IV SOSY
PREFILLED_SYRINGE | INTRAVENOUS | Status: DC | PRN
  Administered 2019-01-18: 60 mg via INTRAVENOUS

## 2019-01-18 SURGICAL SUPPLY — 6 items
CANISTER SUCT 1200ML W/VALVE (MISCELLANEOUS) ×4 IMPLANT
FORCEPS BIOP RAD 4 LRG CAP 4 (CUTTING FORCEPS) ×2 IMPLANT
GOWN CVR UNV OPN BCK APRN NK (MISCELLANEOUS) ×4 IMPLANT
GOWN ISOL THUMB LOOP REG UNIV (MISCELLANEOUS) ×4
KIT ENDO PROCEDURE OLY (KITS) ×4 IMPLANT
WATER STERILE IRR 250ML POUR (IV SOLUTION) ×4 IMPLANT

## 2019-01-18 NOTE — Transfer of Care (Signed)
Immediate Anesthesia Transfer of Care Note  Patient: Brittney Schmidt  Procedure(s) Performed: COLONOSCOPY WITH PROPOFOL (N/A ) POLYPECTOMY (Rectum)  Patient Location: PACU  Anesthesia Type: General  Level of Consciousness: awake, alert  and patient cooperative  Airway and Oxygen Therapy: Patient Spontanous Breathing and Patient connected to supplemental oxygen  Post-op Assessment: Post-op Vital signs reviewed, Patient's Cardiovascular Status Stable, Respiratory Function Stable, Patent Airway and No signs of Nausea or vomiting  Post-op Vital Signs: Reviewed and stable  Complications: No apparent anesthesia complications

## 2019-01-18 NOTE — H&P (Addendum)
Vonda Antigua, MD [address], [place], [place], [place], [address] [address], [place], [place], [place], [address] Phone: [phone]  Fax: [phone]  Primary Care Physician:  Olin Hauser, DO   Pre-Procedure History & Physical: HPI:  Brittney Schmidt is a 68 y.o. female is here for a colonoscopy.   Past Medical History:  Diagnosis Date  . Ankle swelling   . Hypertension   . Wears dentures    full upper, partial lower (doesn't wear lower)    Past Surgical History:  Procedure Laterality Date  . ABDOMINAL HYSTERECTOMY    . TUBAL LIGATION      Prior to Admission medications   Medication Sig Start Date End Date Taking? Authorizing Provider  aspirin 81 MG tablet Take 81 mg by mouth daily.   Yes [provider]  fluticasone (FLONASE) 50 MCG/ACT nasal spray USE 2 SPRAY(S) IN EACH NOSTRIL ONCE DAILY 09/08/18  Yes [provider]  lisinopril-hydrochlorothiazide (ZESTORETIC) 20-25 MG tablet Take 1 tablet by mouth daily. 12/07/18  Yes Olin Hauser, DO    Allergies as of 12/29/2018  . (No Known Allergies)    Family History  Problem Relation Age of Onset  . Diabetes Mother   . Hypertension Mother   . Colon polyps Father   . Heart disease Father   . Heart attack Father   . Cancer Brother   . Hypertension Son   . Breast cancer Neg Hx   . Colon cancer Neg Hx     Social History   Socioeconomic History  . Marital status: Single    Spouse name: Not on file  . Number of children: 3  . Years of education: GED  . Highest education level: Not on file  Occupational History  . 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 Financial resource strain: Not hard at all  . Food insecurity    Worry: Never true    Inability: Never true  . Transportation needs    Medical: No    Non-medical: No  Tobacco Use  . Smoking status: Never Smoker  . Smokeless tobacco: Never Used  Substance and Sexual Activity   . Alcohol use: Yes    Alcohol/week: 2.0 standard drinks    Types: 2 Cans of beer per week    Comment: occasional, planning to quit completely  . Drug use: No  . Sexual activity: Not Currently    Partners: Male  Lifestyle  . Physical activity    Days per week: 0 days    Minutes per session: 0 min  . Stress: Not at all  Relationships  . Social connections    Talks on phone: More than three times a week    Gets together: More than three times a week    Attends religious service: More than 4 times per year    Active member of club or organization: No    Attends meetings of clubs or organizations: Never    Relationship status: Never married  . Intimate partner violence    Fear of current or ex partner: No    Emotionally abused: No    Physically abused: No    Forced sexual activity: No  Other Topics Concern  . Not on file  Social History Narrative   Has family that lives close by    Review of Systems: See HPI, otherwise negative ROS  Physical Exam: BP (!) 156/82   Pulse 84   Temp 98.1 F (36.7 C) (  Temporal)   Resp 18   Ht 5\' 2"  (1.575 m)   Wt 73.5 kg   SpO2 100%   BMI 29.63 kg/m  General:   Alert,  pleasant and cooperative in NAD Head:  Normocephalic and atraumatic. Neck:  Supple; no masses or thyromegaly. Lungs:  Clear throughout to auscultation, normal respiratory effort.    Heart:  +S1, +S2, Regular rate and rhythm, No edema. Abdomen:  Soft, nontender and nondistended. Normal bowel sounds, without guarding, and without rebound.   Neurologic:  Alert and  oriented x4;  grossly normal neurologically.  Impression/Plan: Brittney Schmidt is here for a colonoscopy to be performed for family history of colon cancer in father. Unknown age  Risks, benefits, limitations, and alternatives regarding  colonoscopy have been reviewed with the patient.  Questions have been answered.  All parties agreeable.   Virgel Manifold, MD  01/18/2019, 9:52 AM

## 2019-01-18 NOTE — Op Note (Signed)
[facility] Gastroenterology Patient Name: Brittney Schmidt Procedure Date: 01/18/2019 10:35 AM MRN: [id number] Account #: [id number] Date of Birth: 06/23/1950 Admit Type: Outpatient Age: 68 Room: [facility] OR ROOM 01 Gender: Female Note Status: Finalized Procedure:            Colonoscopy Indications:          Screening in patient at increased risk: Family history                        of 1st-degree relative with colorectal cancer Providers:            Walker Sitar B. Bonna Gains MD, MD Referring MD:         Olin Hauser (Referring MD) Medicines:            Monitored Anesthesia Care Complications:        No immediate complications. Procedure:            Pre-Anesthesia Assessment:                       - ASA Grade Assessment: II - A patient with mild                        systemic disease.                       - Prior to the procedure, a History and Physical was                        performed, and patient medications, allergies and                        sensitivities were reviewed. The patient's tolerance of                        previous anesthesia was reviewed.                       - The risks and benefits of the procedure and the                        sedation options and risks were discussed with the                        patient. All questions were answered and informed                        consent was obtained.                       - Patient identification and proposed procedure were                        verified prior to the procedure by the physician, the                        nurse, the anesthesiologist, the anesthetist and the                        technician. The procedure was verified in the procedure  room.                       After obtaining informed consent, the colonoscope was                        passed under direct vision. Throughout the procedure,                        the patient's blood pressure,  pulse, and oxygen                        saturations were monitored continuously. The was                        introduced through the anus and advanced to the the                        cecum, identified by appendiceal orifice and ileocecal                        valve. The colonoscopy was performed with ease. The                        patient tolerated the procedure well. The quality of                        the bowel preparation was good. Findings:      The perianal and digital rectal examinations were normal.      A 4 mm polyp was found in the ascending colon. The polyp was sessile.       The polyp was removed with a jumbo cold forceps. Resection and retrieval       were complete.      Multiple diverticula were found in the entire colon.      The exam was otherwise without abnormality.      The rectum, sigmoid colon, descending colon, transverse colon, ascending       colon and cecum appeared normal.      Retroflexion in the rectum was not performed due to Narrow rectum.       Careful frontal view of the rectum was otherwise normal. Impression:           - One 4 mm polyp in the ascending colon, removed with a                        jumbo cold forceps. Resected and retrieved.                       - Diverticulosis in the entire examined colon.                       - The examination was otherwise normal.                       - The rectum, sigmoid colon, descending colon,                        transverse colon, ascending colon and cecum are normal. Recommendation:       - Discharge patient to home (with escort).                       -  Advance diet as tolerated.                       - Continue present medications.                       - Await pathology results.                       - Repeat colonoscopy in 5 years.                       - The findings and recommendations were discussed with                        the patient.                       - The findings and  recommendations were discussed with                        the patient's family.                       - Return to primary care physician as previously                        scheduled.                       - High fiber diet. Procedure Code(s):    --- Professional ---                       [phone], Colonoscopy, flexible; with biopsy, single or                        multiple Diagnosis Code(s):    --- Professional ---                       Z80.0, Family history of malignant neoplasm of                        digestive organs                       K63.5, Polyp of colon                       K57.30, Diverticulosis of large intestine without                        perforation or abscess without bleeding CPT copyright 2019 American Medical Association. All rights reserved. The codes documented in this report are preliminary and upon coder review may  be revised to meet current compliance requirements.  Vonda Antigua, MD Margretta Sidle B. Bonna Gains MD, MD 01/18/2019 11:13:42 AM This report has been signed electronically. Number of Addenda: 0 Note Initiated On: 01/18/2019 10:35 AM Scope Withdrawal Time: 0 hours 16 minutes 7 seconds  Total Procedure Duration: 0 hours 20 minutes 35 seconds  Estimated Blood Loss: Estimated blood loss: none.      [facility]

## 2019-01-18 NOTE — Anesthesia Postprocedure Evaluation (Signed)
Anesthesia Post Note  Patient: Brittney DICIOCCIO  Procedure(s) Performed: COLONOSCOPY WITH PROPOFOL (N/A ) POLYPECTOMY (Rectum)  Patient location during evaluation: PACU Anesthesia Type: General Level of consciousness: awake Pain management: pain level controlled Vital Signs Assessment: post-procedure vital signs reviewed and stable Respiratory status: respiratory function stable Cardiovascular status: stable Postop Assessment: no signs of nausea or vomiting Anesthetic complications: no    Veda Canning

## 2019-01-18 NOTE — Discharge Instructions (Signed)

## 2019-01-18 NOTE — Anesthesia Preprocedure Evaluation (Signed)
Anesthesia Evaluation  Patient identified by MRN, date of birth, ID band Patient awake    Reviewed: Allergy & Precautions, NPO status   Airway Mallampati: II  TM Distance: >3 FB     Dental   Pulmonary    breath sounds clear to auscultation       Cardiovascular hypertension,  Rhythm:Regular Rate:Normal     Neuro/Psych    GI/Hepatic GERD  ,  Endo/Other    Renal/GU      Musculoskeletal   Abdominal   Peds  Hematology   Anesthesia Other Findings   Reproductive/Obstetrics                             Anesthesia Physical Anesthesia Plan  ASA: II  Anesthesia Plan: General   Post-op Pain Management:    Induction: Intravenous  PONV Risk Score and Plan: TIVA  Airway Management Planned: Nasal Cannula and Natural Airway  Additional Equipment:   Intra-op Plan:   Post-operative Plan:   Informed Consent: I have reviewed the patients History and Physical, chart, labs and discussed the procedure including the risks, benefits and alternatives for the proposed anesthesia with the patient or authorized representative who has indicated his/her understanding and acceptance.       Plan Discussed with: CRNA  Anesthesia Plan Comments:         Anesthesia Quick Evaluation

## 2019-01-19 ENCOUNTER — Encounter: Payer: Self-pay | Admitting: Gastroenterology

## 2019-01-20 ENCOUNTER — Encounter: Payer: Self-pay | Admitting: Gastroenterology

## 2019-01-26 ENCOUNTER — Encounter: Payer: Self-pay | Admitting: Gastroenterology

## 2019-03-04 DIAGNOSIS — N390 Urinary tract infection, site not specified: Secondary | ICD-10-CM | POA: Diagnosis not present

## 2019-04-13 DIAGNOSIS — Q6689 Other  specified congenital deformities of feet: Secondary | ICD-10-CM | POA: Diagnosis not present

## 2019-04-13 DIAGNOSIS — M2042 Other hammer toe(s) (acquired), left foot: Secondary | ICD-10-CM | POA: Diagnosis not present

## 2019-04-13 DIAGNOSIS — M778 Other enthesopathies, not elsewhere classified: Secondary | ICD-10-CM | POA: Diagnosis not present

## 2019-04-13 DIAGNOSIS — G5762 Lesion of plantar nerve, left lower limb: Secondary | ICD-10-CM | POA: Diagnosis not present

## 2019-04-13 DIAGNOSIS — M2012 Hallux valgus (acquired), left foot: Secondary | ICD-10-CM | POA: Diagnosis not present

## 2019-04-13 DIAGNOSIS — M2011 Hallux valgus (acquired), right foot: Secondary | ICD-10-CM | POA: Diagnosis not present

## 2019-04-13 DIAGNOSIS — M898X9 Other specified disorders of bone, unspecified site: Secondary | ICD-10-CM | POA: Diagnosis not present

## 2019-05-02 ENCOUNTER — Other Ambulatory Visit: Payer: Medicare Other

## 2019-05-02 ENCOUNTER — Other Ambulatory Visit: Payer: Self-pay

## 2019-05-02 DIAGNOSIS — I10 Essential (primary) hypertension: Secondary | ICD-10-CM

## 2019-05-02 DIAGNOSIS — E669 Obesity, unspecified: Secondary | ICD-10-CM | POA: Diagnosis not present

## 2019-05-02 DIAGNOSIS — Z0000 Encounter for general adult medical examination without abnormal findings: Secondary | ICD-10-CM

## 2019-05-02 DIAGNOSIS — R7309 Other abnormal glucose: Secondary | ICD-10-CM | POA: Diagnosis not present

## 2019-05-03 LAB — CBC WITH DIFFERENTIAL/PLATELET
Absolute Monocytes: 377 cells/uL (ref 200–950)
Basophils Absolute: 28 cells/uL (ref 0–200)
Basophils Relative: 0.6 %
Eosinophils Absolute: 60 cells/uL (ref 15–500)
Eosinophils Relative: 1.3 %
HCT: 41.2 % (ref 35.0–45.0)
Hemoglobin: 13.5 g/dL (ref 11.7–15.5)
Lymphs Abs: 2102 cells/uL (ref 850–3900)
MCH: 27.4 pg (ref 27.0–33.0)
MCHC: 32.8 g/dL (ref 32.0–36.0)
MCV: 83.6 fL (ref 80.0–100.0)
MPV: 10.8 fL (ref 7.5–12.5)
Monocytes Relative: 8.2 %
Neutro Abs: 2033 cells/uL (ref 1500–7800)
Neutrophils Relative %: 44.2 %
Platelets: 205 10*3/uL (ref 140–400)
RBC: 4.93 10*6/uL (ref 3.80–5.10)
RDW: 12.3 % (ref 11.0–15.0)
Total Lymphocyte: 45.7 %
WBC: 4.6 10*3/uL (ref 3.8–10.8)

## 2019-05-03 LAB — COMPREHENSIVE METABOLIC PANEL
AG Ratio: 1.6 (calc) (ref 1.0–2.5)
ALT: 13 U/L (ref 6–29)
AST: 15 U/L (ref 10–35)
Albumin: 4 g/dL (ref 3.6–5.1)
Alkaline phosphatase (APISO): 47 U/L (ref 37–153)
BUN: 12 mg/dL (ref 7–25)
CO2: 29 mmol/L (ref 20–32)
Calcium: 9.2 mg/dL (ref 8.6–10.4)
Chloride: 105 mmol/L (ref 98–110)
Creat: 0.8 mg/dL (ref 0.50–0.99)
Globulin: 2.5 g/dL (calc) (ref 1.9–3.7)
Glucose, Bld: 107 mg/dL — ABNORMAL HIGH (ref 65–99)
Potassium: 3.5 mmol/L (ref 3.5–5.3)
Sodium: 142 mmol/L (ref 135–146)
Total Bilirubin: 0.8 mg/dL (ref 0.2–1.2)
Total Protein: 6.5 g/dL (ref 6.1–8.1)

## 2019-05-03 LAB — HEMOGLOBIN A1C
Hgb A1c MFr Bld: 5.3 % of total Hgb (ref <5.7)
Mean Plasma Glucose: 105 (calc)
eAG (mmol/L): 5.8 (calc)

## 2019-05-03 LAB — LIPID PANEL
Cholesterol: 174 mg/dL (ref <200)
HDL: 45 mg/dL — ABNORMAL LOW (ref >=50)
LDL Cholesterol (Calc): 109 mg/dL (calc) — ABNORMAL HIGH
Non-HDL Cholesterol (Calc): 129 mg/dL (calc) (ref <130)
Total CHOL/HDL Ratio: 3.9 (calc) (ref <5.0)
Triglycerides: 104 mg/dL (ref <150)

## 2019-05-05 ENCOUNTER — Encounter: Payer: Medicare Other | Admitting: Family Medicine

## 2019-05-09 ENCOUNTER — Encounter: Payer: Self-pay | Admitting: Family Medicine

## 2019-05-09 ENCOUNTER — Ambulatory Visit (INDEPENDENT_AMBULATORY_CARE_PROVIDER_SITE_OTHER): Payer: Medicare Other | Admitting: Family Medicine

## 2019-05-09 ENCOUNTER — Other Ambulatory Visit: Payer: Self-pay

## 2019-05-09 VITALS — BP 132/57 | HR 72 | Temp 97.5°F | Resp 16 | Ht 62.0 in | Wt 168.0 lb

## 2019-05-09 DIAGNOSIS — I10 Essential (primary) hypertension: Secondary | ICD-10-CM

## 2019-05-09 DIAGNOSIS — E669 Obesity, unspecified: Secondary | ICD-10-CM | POA: Diagnosis not present

## 2019-05-09 DIAGNOSIS — K219 Gastro-esophageal reflux disease without esophagitis: Secondary | ICD-10-CM | POA: Diagnosis not present

## 2019-05-09 NOTE — Progress Notes (Signed)
Subjective:    Patient ID: Brittney Schmidt, female    DOB: 05/27/1950, 69 y.o.   MRN: [id number]  Brittney Schmidt is a 69 y.o. female presenting on 05/09/2019 for Hypertension   HPI   CHRONIC HTN / Obesity BMI >30 Reports no new concern. Checking BP occasionally, doing well. No new problems. Current Meds - Lisinopril-HCTZ 20-25mg  daily   Reports good compliance, took meds today. Tolerating well, w/o complaints. Lifestyle: - She is active, cleaning, shopping. Caring for grandchildren at times. - Diet: balanced, not particular diet, limits salt - Exercise: no regular exercise currently still Denies CP, dyspnea, HA, edema, dizziness / lightheadedness  GERD Managed on diet. Not on medication. Doing well not having any significant breakthrough symptoms.   Health Maintenance: UTD Flu Vaccine.  Colon CA Screening: Last Colonoscopy 01/18/19 (done by Dr Bonna Gains [facility]I), results with 1 x polyp tubular adenoma polyp and diverticulosis, good for 5 years. Currently asymptomatic. known family history of colon CA.  COVID19 vaccine trying to get on list [facility]  [facility]   Depression screen [facility] 2/9 05/09/2019 12/07/2018 11/16/2018  Decreased Interest 0 0 0  Down, Depressed, Hopeless 0 1 0  PHQ - 2 Score 0 1 0  Altered sleeping - (No Data) -  Difficult doing work/chores - Not difficult at all -    Past Medical History:  Diagnosis Date  . Ankle swelling   . Hypertension   . Wears dentures    full upper, partial lower (doesn't wear lower)   Past Surgical History:  Procedure Laterality Date  . ABDOMINAL HYSTERECTOMY    . COLONOSCOPY WITH PROPOFOL N/A 01/18/2019   Procedure: COLONOSCOPY WITH PROPOFOL;  Surgeon: Virgel Manifold, MD;  Location: [place];  Service: Endoscopy;  Laterality: N/A;  . POLYPECTOMY  01/18/2019   Procedure: POLYPECTOMY;  Surgeon: Virgel Manifold, MD;  Location: [place];  Service: Endoscopy;;  . TUBAL LIGATION      Social History   Socioeconomic History  . Marital status: Single    Spouse name: Not on file  . Number of children: 3  . Years of education: GED  . Highest education level: Not on file  Occupational History  . Occupation: unemployed (previously worked as temp)  Tobacco Use  . Smoking status: Never Smoker  . Smokeless tobacco: Never Used  Substance and Sexual Activity  . Alcohol use: Yes    Alcohol/week: 2.0 standard drinks    Types: 2 Cans of beer per week    Comment: occasional, planning to quit completely  . Drug use: No  . Sexual activity: Not Currently    Partners: Male  Other Topics Concern  . Not on file  Social History Narrative   Has family that lives close by   Social Determinants of Health   Financial Resource Strain: Low Risk   . Difficulty of Paying Living Expenses: Not hard at all  Food Insecurity: No Food Insecurity  . Worried About Charity fundraiser in the Last Year: Never true  . Ran Out of Food in the Last Year: Never true  Transportation Needs: No Transportation Needs  . Lack of Transportation (Medical): No  . Lack of Transportation (Non-Medical): No  Physical Activity: Inactive  . Days of Exercise per Week: 0 days  . Minutes of Exercise per Session: 0 min  Stress: No Stress Concern Present  . Feeling of Stress : Not at all  Social Connections: Somewhat Isolated  . Frequency of Communication  with Friends and Family: More than three times a week  . Frequency of Social Gatherings with Friends and Family: More than three times a week  . Attends Religious Services: More than 4 times per year  . Active Member of Clubs or Organizations: No  . Attends Archivist Meetings: Never  . Marital Status: Never married  Intimate Partner Violence: Not At Risk  . Fear of Current or Ex-Partner: No  . Emotionally Abused: No  . Physically Abused: No  . Sexually Abused: No   Family History  Problem Relation Age of Onset  . Diabetes Mother   .  Hypertension Mother   . Colon polyps Father   . Heart disease Father   . Heart attack Father   . Cancer Brother   . Hypertension Son   . Breast cancer Neg Hx   . Colon cancer Neg Hx    Current Outpatient Medications on File Prior to Visit  Medication Sig  . aspirin 81 MG tablet Take 81 mg by mouth daily.  . fluticasone (FLONASE) 50 MCG/ACT nasal spray USE 2 SPRAY(S) IN EACH NOSTRIL ONCE DAILY  . lisinopril-hydrochlorothiazide (ZESTORETIC) 20-25 MG tablet Take 1 tablet by mouth daily.   No current facility-administered medications on file prior to visit.    Review of Systems  Constitutional: Negative for activity change, appetite change, chills, diaphoresis, fatigue and fever.  HENT: Negative for congestion and hearing loss.   Eyes: Negative for visual disturbance.  Respiratory: Negative for apnea, cough, chest tightness, shortness of breath and wheezing.   Cardiovascular: Negative for chest pain, palpitations and leg swelling.  Gastrointestinal: Negative for abdominal pain, anal bleeding, blood in stool, constipation, diarrhea, nausea and vomiting.  Endocrine: Negative for cold intolerance.  Genitourinary: Negative for difficulty urinating, dysuria, frequency and hematuria.  Musculoskeletal: Negative for arthralgias, back pain and neck pain.  Skin: Negative for rash.  Allergic/Immunologic: Negative for environmental allergies.  Neurological: Negative for dizziness, weakness, light-headedness, numbness and headaches.  Hematological: Negative for adenopathy.  Psychiatric/Behavioral: Negative for behavioral problems, dysphoric mood and sleep disturbance. The patient is not nervous/anxious.    Per HPI unless specifically indicated above      Objective:    BP (!) 132/57   Pulse 72   Temp (!) 97.5 F (36.4 C) (Oral)   Resp 16   Ht 5\' 2"  (1.575 m)   Wt 168 lb (76.2 kg)   BMI 30.73 kg/m   Wt Readings from Last 3 Encounters:  05/09/19 168 lb (76.2 kg)  01/18/19 162 lb (73.5  kg)  12/07/18 172 lb (78 kg)    Physical Exam Vitals and nursing note reviewed.  Constitutional:      General: She is not in acute distress.    Appearance: She is well-developed. She is not diaphoretic.     Comments: Well-appearing, comfortable, cooperative  HENT:     Head: Normocephalic and atraumatic.  Eyes:     General:        Right eye: No discharge.        Left eye: No discharge.     Conjunctiva/sclera: Conjunctivae normal.     Pupils: Pupils are equal, round, and reactive to light.  Neck:     Thyroid: No thyromegaly.  Cardiovascular:     Rate and Rhythm: Normal rate and regular rhythm.     Heart sounds: Normal heart sounds. No murmur.  Pulmonary:     Effort: Pulmonary effort is normal. No respiratory distress.     Breath sounds:  Normal breath sounds. No wheezing or rales.  Abdominal:     General: Bowel sounds are normal. There is no distension.     Palpations: Abdomen is soft. There is no mass.     Tenderness: There is no abdominal tenderness.  Musculoskeletal:        General: No tenderness. Normal range of motion.     Cervical back: Normal range of motion and neck supple.     Comments: Upper / Lower Extremities: - Normal muscle tone, strength bilateral upper extremities 5/5, lower extremities 5/5  Lymphadenopathy:     Cervical: No cervical adenopathy.  Skin:    General: Skin is warm and dry.     Findings: No erythema or rash.  Neurological:     Mental Status: She is alert and oriented to person, place, and time.     Comments: Distal sensation intact to light touch all extremities  Psychiatric:        Behavior: Behavior normal.     Comments: Well groomed, good eye contact, normal speech and thoughts    Results for orders placed or performed in visit on 05/02/19  Comprehensive metabolic panel  Result Value Ref Range   Glucose, Bld 107 (H) 65 - 99 mg/dL   BUN 12 7 - 25 mg/dL   Creat 0.80 0.50 - 0.99 mg/dL   BUN/Creatinine Ratio NOT APPLICABLE 6 - 22 (calc)    Sodium 142 135 - 146 mmol/L   Potassium 3.5 3.5 - 5.3 mmol/L   Chloride 105 98 - 110 mmol/L   CO2 29 20 - 32 mmol/L   Calcium 9.2 8.6 - 10.4 mg/dL   Total Protein 6.5 6.1 - 8.1 g/dL   Albumin 4.0 3.6 - 5.1 g/dL   Globulin 2.5 1.9 - 3.7 g/dL (calc)   AG Ratio 1.6 1.0 - 2.5 (calc)   Total Bilirubin 0.8 0.2 - 1.2 mg/dL   Alkaline phosphatase (APISO) 47 37 - 153 U/L   AST 15 10 - 35 U/L   ALT 13 6 - 29 U/L  Hemoglobin A1c  Result Value Ref Range   Hgb A1c MFr Bld 5.3 <5.7 % of total Hgb   Mean Plasma Glucose 105 (calc)   eAG (mmol/L) 5.8 (calc)  CBC with Differential/Platelet  Result Value Ref Range   WBC 4.6 3.8 - 10.8 Thousand/uL   RBC 4.93 3.80 - 5.10 Million/uL   Hemoglobin 13.5 11.7 - 15.5 g/dL   HCT 41.2 35.0 - 45.0 %   MCV 83.6 80.0 - 100.0 fL   MCH 27.4 27.0 - 33.0 pg   MCHC 32.8 32.0 - 36.0 g/dL   RDW 12.3 11.0 - 15.0 %   Platelets 205 140 - 400 Thousand/uL   MPV 10.8 7.5 - 12.5 fL   Neutro Abs 2,033 1,500 - 7,800 cells/uL   Lymphs Abs 2,102 850 - 3,900 cells/uL   Absolute Monocytes 377 200 - 950 cells/uL   Eosinophils Absolute 60 15 - 500 cells/uL   Basophils Absolute 28 0 - 200 cells/uL   Neutrophils Relative % 44.2 %   Total Lymphocyte 45.7 %   Monocytes Relative 8.2 %   Eosinophils Relative 1.3 %   Basophils Relative 0.6 %  Lipid panel  Result Value Ref Range   Cholesterol 174 <200 mg/dL   HDL 45 (L) > OR = 50 mg/dL   Triglycerides 104 <150 mg/dL   LDL Cholesterol (Calc) 109 (H) mg/dL (calc)   Total CHOL/HDL Ratio 3.9 <5.0 (calc)   Non-HDL Cholesterol (Calc)  129 <130 mg/dL (calc)      Assessment & Plan:   Problem List Items Addressed This Visit    Obesity (BMI 30.0-34.9)    Encouraging weight loss, lifestyle improvement      Gastroesophageal reflux disease    Controlled On diet management now Not on medicine      Essential hypertension - Primary    Well-controlled HTN - Home BP readings none  No known complications    Plan:  1. Continue  current BP regimen - Lisinopril 20-25mg  daily 2. Encourage improved lifestyle - low sodium diet, regular exercise 3. Continue monitor BP outside office, bring readings to next visit, if persistently >140/90 or new symptoms notify office sooner         Updated Health Maintenance information - info for COVID19 vaccine through [place] sign up given - Next colonoscopy due 2025 Reviewed recent lab results with patient Encouraged improvement to lifestyle with diet and exercise - Goal of weight loss   No orders of the defined types were placed in this encounter.     Follow up plan: Return in about 6 months (around 11/06/2019) for 6 month HTN.  Nobie Putnam, [facility] 05/09/2019, 10:33 AM

## 2019-05-09 NOTE — Assessment & Plan Note (Signed)
Well-controlled HTN - Home BP readings none  No known complications    Plan:  1. Continue current BP regimen - Lisinopril 20-25mg daily 2. Encourage improved lifestyle - low sodium diet, regular exercise 3. Continue monitor BP outside office, bring readings to next visit, if persistently >140/90 or new symptoms notify office sooner 

## 2019-05-09 NOTE — Assessment & Plan Note (Signed)
Controlled On diet management now Not on medicine

## 2019-05-09 NOTE — Assessment & Plan Note (Signed)
Encouraging weight loss, lifestyle improvement

## 2019-05-09 NOTE — Patient Instructions (Addendum)
Thank you for coming to the office today.  Next Colonoscopy is in 5 years. 2025  --------------------  [place] COVID19 Vaccine Information  LOCATION:  [facility]) [place] [address]  Hours: Monday - Sunday 8:00am to 12:00pm  COVID-19 Vaccines By Appointment Only  Sign up for [place] List  [email or web address] or text "VACCINE" to [phone] or call [phone]    Please schedule a Follow-up Appointment to: Return in about 6 months (around 11/06/2019) for 6 month HTN.  If you have any other questions or concerns, please feel free to call the office or send a message through [place]. You may also schedule an earlier appointment if necessary.  Additionally, you may be receiving a survey about your experience at our office within a few days to 1 week by e-mail or mail. We value your feedback.  Nobie Putnam, DO [place]

## 2019-05-16 ENCOUNTER — Encounter: Payer: Medicare Other | Admitting: Family Medicine

## 2019-06-16 ENCOUNTER — Ambulatory Visit: Payer: Medicare Other | Attending: Internal Medicine

## 2019-06-16 DIAGNOSIS — Z23 Encounter for immunization: Secondary | ICD-10-CM

## 2019-06-16 NOTE — Progress Notes (Signed)
   Covid-19 Vaccination Clinic  Name:  ADALISE KORANDA    MRN: [id number] DOB: 1950-07-25  06/16/2019  Ms. Laroche was observed post Covid-19 immunization for 15 minutes without incident. She was provided with Vaccine Information Sheet and instruction to access the V-Safe system.   Ms. Pennoyer was instructed to call 911 with any severe reactions post vaccine: Marland Kitchen Difficulty breathing  . Swelling of face and throat  . A fast heartbeat  . A bad rash all over body  . Dizziness and weakness   Immunizations Administered    Name Date Dose VIS Date Route   Pfizer COVID-19 Vaccine 06/16/2019 11:06 AM 0.3 mL 03/04/2019 Intramuscular   Manufacturer: [place]   Lot: [id number]   [place]: [id number]

## 2019-07-12 ENCOUNTER — Ambulatory Visit: Payer: Medicare Other | Attending: Internal Medicine

## 2019-07-12 DIAGNOSIS — Z23 Encounter for immunization: Secondary | ICD-10-CM

## 2019-07-12 NOTE — Progress Notes (Signed)
   Covid-19 Vaccination Clinic  Name:  Brittney Schmidt    MRN: [id number] DOB: [birth year]  07/12/2019  [name] was observed post Covid-19 immunization for 15 minutes without incident. She was provided with Vaccine Information Sheet and instruction to access the V-Safe system.   [name] was instructed to call 911 with any severe reactions post vaccine: Marland Kitchen Difficulty breathing  . Swelling of face and throat  . A fast heartbeat  . A bad rash all over body  . Dizziness and weakness   Immunizations Administered    Name Date Dose VIS Date Route   Pfizer COVID-19 Vaccine 07/12/2019 10:23 AM 0.3 mL 05/18/2018 Intramuscular   Manufacturer: [employer]   Lot: [id number]   [place]: [id number]

## 2019-07-15 ENCOUNTER — Telehealth (INDEPENDENT_AMBULATORY_CARE_PROVIDER_SITE_OTHER): Payer: Medicare Other | Admitting: Family Medicine

## 2019-07-15 ENCOUNTER — Encounter: Payer: Self-pay | Admitting: Family Medicine

## 2019-07-15 ENCOUNTER — Other Ambulatory Visit: Payer: Self-pay

## 2019-07-15 DIAGNOSIS — R05 Cough: Secondary | ICD-10-CM | POA: Diagnosis not present

## 2019-07-15 DIAGNOSIS — R059 Cough, unspecified: Secondary | ICD-10-CM

## 2019-07-15 MED ORDER — GUAIFENESIN-CODEINE 100-10 MG/5ML PO SYRP: 5.0000 mL | ORAL_SOLUTION | Freq: Four times a day (QID) | ORAL | 0 refills | Status: DC | PRN

## 2019-07-15 NOTE — Progress Notes (Signed)
Virtual Visit via Telephone The purpose of this virtual visit is to provide medical care while limiting exposure to the novel coronavirus (COVID19) for both patient and office staff.  Consent was obtained for phone visit:  Yes.   Answered questions that patient had about telehealth interaction:  Yes.   I discussed the limitations, risks, security and privacy concerns of performing an evaluation and management service by telephone. I also discussed with the patient that there may be a patient responsible charge related to this service. The patient expressed understanding and agreed to proceed.  Patient Location: Home Provider Location: [facility])   ---------------------------------------------------------------------- Chief Complaint  Patient presents with  . Cough    onset yesterday denies any other Sxs juat the cough    S: Reviewed CMA documentation. I have called patient and gathered additional HPI as follows:  COUGHING Reports that symptoms started last night, worse in evening, during the day. Worse at night. Associated with sore throat. She uses allergy meds including nasal spray flonase. Limited relief. Had similar cough in spring last year, resolved w cough syrup.  Finished her 2nd dose COVID19 vaccine on 07/12/19 - 3 days ago on Tuesday  Denies any known or suspected exposure to person with or possibly with COVID19.  Denies any fevers, chills, sweats, body ache, shortness of breath, sinus pain or pressure, headache, abdominal pain, diarrhea  -------------------------------------------------------------------------- O: No physical exam performed due to remote telephone encounter.  -------------------------------------------------------------------------- A&P:  Suspected Acute Cough from either allergic rhinosinusitis symptoms vs possible side effect from COVID19 vaccine or early developing URI following vaccine.  Less likely to be covid19 now that  just finished 2nd dose vaccine, afebrile, no other concerning features that are high risk.  1. Reassurance 2. Re order Virtussin cough syrup that was successful before, precautions given using this cough syrup w codeine, infrequent use 3. May use OTC cold cough remedy 4. Continue allergy therapy Flonase 5. Improve hydration 6. Follow-up if not improving or worsening  No orders of the defined types were placed in this encounter.   OPTIONAL RECOMMENDED self quarantine for patient safety for PREVENTION ONLY. It is not required based on current clinical symptoms. If they were to develop fever or worsening shortness of breath, then emphasis on REQUIRED quarantine for up to 7-14 days that could be resolved if fever free >3 days AND if symptoms improving after 7 days.   If symptoms do not resolve or significantly improve OR if WORSENING - fever / cough - or worsening shortness of breath - then should contact us and seek advice on next steps in treatment at home vs where/when to seek care at Urgent Care or Hospital ED for further intervention and possible testing if indicated.  Patient verbalizes understanding with the above medical recommendations including the limitation of remote medical advice.  Specific follow-up / call-back criteria were given for patient to follow-up or seek medical care more urgently if needed.   - Time spent in direct consultation with patient on phone: 8 minutes   Nobie Putnam, [facility] 07/15/2019, 1:39 PM

## 2019-08-18 ENCOUNTER — Other Ambulatory Visit: Payer: Self-pay | Admitting: Family Medicine

## 2019-08-18 DIAGNOSIS — Z1231 Encounter for screening mammogram for malignant neoplasm of breast: Secondary | ICD-10-CM

## 2019-08-29 DIAGNOSIS — M545 Low back pain: Secondary | ICD-10-CM | POA: Diagnosis not present

## 2019-08-29 DIAGNOSIS — A499 Bacterial infection, unspecified: Secondary | ICD-10-CM | POA: Diagnosis not present

## 2019-08-29 DIAGNOSIS — R350 Frequency of micturition: Secondary | ICD-10-CM | POA: Diagnosis not present

## 2019-08-29 DIAGNOSIS — N390 Urinary tract infection, site not specified: Secondary | ICD-10-CM | POA: Diagnosis not present

## 2019-09-08 ENCOUNTER — Ambulatory Visit
Admission: RE | Admit: 2019-09-08 | Discharge: 2019-09-08 | Disposition: A | Discharge status: Home / Self Care | Payer: Medicare Other | Source: Ambulatory Visit | Attending: Family Medicine | Admitting: Family Medicine

## 2019-09-08 DIAGNOSIS — Z1231 Encounter for screening mammogram for malignant neoplasm of breast: Secondary | ICD-10-CM

## 2019-09-08 DIAGNOSIS — H2513 Age-related nuclear cataract, bilateral: Secondary | ICD-10-CM | POA: Diagnosis not present

## 2019-11-02 ENCOUNTER — Encounter: Payer: Self-pay | Admitting: Family Medicine

## 2019-11-02 ENCOUNTER — Other Ambulatory Visit: Payer: Self-pay

## 2019-11-02 ENCOUNTER — Ambulatory Visit (INDEPENDENT_AMBULATORY_CARE_PROVIDER_SITE_OTHER): Payer: Medicare Other | Admitting: Family Medicine

## 2019-11-02 VITALS — BP 128/60 | HR 64 | Temp 97.3°F | Resp 16 | Ht 62.0 in | Wt 172.0 lb

## 2019-11-02 DIAGNOSIS — E669 Obesity, unspecified: Secondary | ICD-10-CM

## 2019-11-02 DIAGNOSIS — E785 Hyperlipidemia, unspecified: Secondary | ICD-10-CM | POA: Diagnosis not present

## 2019-11-02 DIAGNOSIS — I10 Essential (primary) hypertension: Secondary | ICD-10-CM

## 2019-11-02 MED ORDER — LISINOPRIL-HYDROCHLOROTHIAZIDE 20-25 MG PO TABS: 1.0000 | ORAL_TABLET | Freq: Every day | ORAL | 3 refills | Status: DC

## 2019-11-02 NOTE — Patient Instructions (Addendum)
Thank you for coming to the office today.  Please schedule and return for a NURSE ONLY VISIT for VACCINE - Approximately around September October 2021 - Need High Dose Flu Vaccine  Refilled BP med  Work on lifestyle  DUE for FASTING BLOOD WORK (no food or drink after midnight before the lab appointment, only water or coffee without cream/sugar on the morning of)  SCHEDULE "Lab Only" visit in the morning at the clinic for lab draw in 6 MONTHS   - Make sure Lab Only appointment is at about 1 week before your next appointment, so that results will be available  For Lab Results, once available within 2-3 days of blood draw, you can can log in to MyChart online to view your results and a brief explanation. Also, we can discuss results at next follow-up visit.   Please schedule a Follow-up Appointment to: Return in about 6 months (around 05/04/2020) for 6 month follow-up HTN .  If you have any other questions or concerns, please feel free to call the office or send a message through [place]. You may also schedule an earlier appointment if necessary.  Additionally, you may be receiving a survey about your experience at our office within a few days to 1 week by e-mail or mail. We value your feedback.  Nobie Putnam, DO [place]

## 2019-11-02 NOTE — Progress Notes (Addendum)
Subjective:    Patient ID: Brittney Schmidt, female    DOB: 1950-08-24, 69 y.o.   MRN: [id number]  Brittney Schmidt is a 69 y.o. female presenting on 11/02/2019 for Hypertension   HPI   CHRONIC HTN/ Obesity BMI >30 Reportsno new concern. She is checking BP occasionally, doing well Needs refill today Current Meds -Lisinopril-HCTZ 20-25mg  daily Reports good compliance, took meds today. Tolerating well, w/o complaints. Lifestyle: Mild weight gain 4-5 lbs lately - She is active, cleaning, shopping. Caring for grandchildren at times. - Diet:balanced, not particular diet, limits salt - Exercise:trying now to do more regular walking for exercise Denies CP, dyspnea, HA, edema, dizziness / lightheadedness  DYSLIPIDEMIA: - Reports no concerns. Last lipid panel 04/2019, showed mild elevated LDL and low HDL Not on statin therapy She is not interested in statin at this time Lifestyle See above  Health Maintenance:  Cervical CA Screening: Prior negative Paps, Negative HPV, last done 2018 (3 yr ago), now age 39, she declines repeat pap smear currently.  Breast CA Screening: Last mammogram done 08/2019, negative. Check yearly.  Colon CA Screening: Last Colonoscopy 01/18/19 (done by Dr Bonna Gains [facility]I), results with 1 x polyp tubular adenoma polyp and diverticulosis, good for 5 years. Currently asymptomatic. known family history of colon CA.   Depression screen [facility] 2/9 11/02/2019 07/15/2019 05/09/2019  Decreased Interest 0 0 0  Down, Depressed, Hopeless 0 0 0  PHQ - 2 Score 0 0 0  Altered sleeping - - -  Difficult doing work/chores - - -    Past Medical History:  Diagnosis Date  . Ankle swelling   . Hypertension   . Wears dentures    full upper, partial lower (doesn't wear lower)   Past Surgical History:  Procedure Laterality Date  . ABDOMINAL HYSTERECTOMY    . COLONOSCOPY WITH PROPOFOL N/A 01/18/2019   Procedure: COLONOSCOPY WITH PROPOFOL;  Surgeon: Virgel Manifold, MD;   Location: [facility];  Service: Endoscopy;  Laterality: N/A;  . POLYPECTOMY  01/18/2019   Procedure: POLYPECTOMY;  Surgeon: Virgel Manifold, MD;  Location: [place];  Service: Endoscopy;;  . TUBAL LIGATION     Social History   Socioeconomic History  . Marital status: Single    Spouse name: Not on file  . Number of children: 3  . Years of education: GED  . Highest education level: Not on file  Occupational History  . Occupation: unemployed (previously worked as temp)  Tobacco Use  . Smoking status: Never Smoker  . Smokeless tobacco: Never Used  Vaping Use  . Vaping Use: Never used  Substance and Sexual Activity  . Alcohol use: Yes    Alcohol/week: 2.0 standard drinks    Types: 2 Cans of beer per week    Comment: occasional, planning to quit completely  . Drug use: No  . Sexual activity: Not Currently    Partners: Male  Other Topics Concern  . Not on file  Social History Narrative   Has family that lives close by   Social Determinants of Health   Financial Resource Strain: Low Risk   . Difficulty of Paying Living Expenses: Not hard at all  Food Insecurity: No Food Insecurity  . Worried About Charity fundraiser in the Last Year: Never true  . Ran Out of Food in the Last Year: Never true  Transportation Needs: No Transportation Needs  . Lack of Transportation (Medical): No  . Lack of Transportation (Non-Medical): No  Physical  Activity: Inactive  . Days of Exercise per Week: 0 days  . Minutes of Exercise per Session: 0 min  Stress: No Stress Concern Present  . Feeling of Stress : Not at all  Social Connections: Moderately Isolated  . Frequency of Communication with Friends and Family: More than three times a week  . Frequency of Social Gatherings with Friends and Family: More than three times a week  . Attends Religious Services: More than 4 times per year  . Active Member of Clubs or Organizations: No  . Attends Archivist Meetings:  Never  . Marital Status: Never married  Intimate Partner Violence: Not At Risk  . Fear of Current or Ex-Partner: No  . Emotionally Abused: No  . Physically Abused: No  . Sexually Abused: No   Family History  Problem Relation Age of Onset  . Diabetes Mother   . Hypertension Mother   . Colon polyps Father   . Heart disease Father   . Heart attack Father   . Cancer Brother   . Hypertension Son   . Breast cancer Neg Hx   . Colon cancer Neg Hx    Current Outpatient Medications on File Prior to Visit  Medication Sig  . aspirin 81 MG tablet Take 81 mg by mouth daily.  . fluticasone (FLONASE) 50 MCG/ACT nasal spray USE 2 SPRAY(S) IN EACH NOSTRIL ONCE DAILY   No current facility-administered medications on file prior to visit.    Review of Systems Per HPI unless specifically indicated above      Objective:    BP 128/60   Pulse 64   Temp (!) 97.3 F (36.3 C) (Temporal)   Resp 16   Ht 5\' 2"  (1.575 m)   Wt 172 lb (78 kg)   SpO2 100%   BMI 31.46 kg/m   Wt Readings from Last 3 Encounters:  11/02/19 172 lb (78 kg)  05/09/19 168 lb (76.2 kg)  01/18/19 162 lb (73.5 kg)    Physical Exam Vitals and nursing note reviewed.  Constitutional:      General: She is not in acute distress.    Appearance: She is well-developed. She is not diaphoretic.     Comments: Well-appearing, comfortable, cooperative  HENT:     Head: Normocephalic and atraumatic.  Eyes:     General:        Right eye: No discharge.        Left eye: No discharge.     Conjunctiva/sclera: Conjunctivae normal.  Neck:     Thyroid: No thyromegaly.  Cardiovascular:     Rate and Rhythm: Normal rate and regular rhythm.     Heart sounds: Normal heart sounds. No murmur heard.   Pulmonary:     Effort: Pulmonary effort is normal. No respiratory distress.     Breath sounds: Normal breath sounds. No wheezing or rales.  Musculoskeletal:        General: Normal range of motion.     Cervical back: Normal range of motion  and neck supple.  Lymphadenopathy:     Cervical: No cervical adenopathy.  Skin:    General: Skin is warm and dry.     Findings: No erythema or rash.  Neurological:     Mental Status: She is alert and oriented to person, place, and time.  Psychiatric:        Behavior: Behavior normal.     Comments: Well groomed, good eye contact, normal speech and thoughts      I have  personally reviewed the radiology report from 09/08/19.  CLINICAL DATA:  Screening.  EXAM: DIGITAL SCREENING BILATERAL MAMMOGRAM WITH TOMO AND CAD  COMPARISON:  Previous exam(s).  ACR Breast Density Category b: There are scattered areas of fibroglandular density.  FINDINGS: There are no findings suspicious for malignancy. Images were processed with CAD.  IMPRESSION: No mammographic evidence of malignancy. A result letter of this screening mammogram will be mailed directly to the patient.  RECOMMENDATION: Screening mammogram in one year. (Code:SM-B-01Y)  BI-RADS CATEGORY  1: Negative.   Electronically Signed   By: Ammie Ferrier M.D.   On: 09/09/2019 10:53  Results for orders placed or performed in visit on 05/02/19  Comprehensive metabolic panel  Result Value Ref Range   Glucose, Bld 107 (H) 65 - 99 mg/dL   BUN 12 7 - 25 mg/dL   Creat 0.80 0.50 - 0.99 mg/dL   BUN/Creatinine Ratio NOT APPLICABLE 6 - 22 (calc)   Sodium 142 135 - 146 mmol/L   Potassium 3.5 3.5 - 5.3 mmol/L   Chloride 105 98 - 110 mmol/L   CO2 29 20 - 32 mmol/L   Calcium 9.2 8.6 - 10.4 mg/dL   Total Protein 6.5 6.1 - 8.1 g/dL   Albumin 4.0 3.6 - 5.1 g/dL   Globulin 2.5 1.9 - 3.7 g/dL (calc)   AG Ratio 1.6 1.0 - 2.5 (calc)   Total Bilirubin 0.8 0.2 - 1.2 mg/dL   Alkaline phosphatase (APISO) 47 37 - 153 U/L   AST 15 10 - 35 U/L   ALT 13 6 - 29 U/L  Hemoglobin A1c  Result Value Ref Range   Hgb A1c MFr Bld 5.3 <5.7 % of total Hgb   Mean Plasma Glucose 105 (calc)   eAG (mmol/L) 5.8 (calc)  CBC with  Differential/Platelet  Result Value Ref Range   WBC 4.6 3.8 - 10.8 Thousand/uL   RBC 4.93 3.80 - 5.10 Million/uL   Hemoglobin 13.5 11.7 - 15.5 g/dL   HCT 41.2 35 - 45 %   MCV 83.6 80.0 - 100.0 fL   MCH 27.4 27.0 - 33.0 pg   MCHC 32.8 32.0 - 36.0 g/dL   RDW 12.3 11.0 - 15.0 %   Platelets 205 140 - 400 Thousand/uL   MPV 10.8 7.5 - 12.5 fL   Neutro Abs 2,033 1,500 - 7,800 cells/uL   Lymphs Abs 2,102 850 - 3,900 cells/uL   Absolute Monocytes 377 200 - 950 cells/uL   Eosinophils Absolute 60 15 - 500 cells/uL   Basophils Absolute 28 0 - 200 cells/uL   Neutrophils Relative % 44.2 %   Total Lymphocyte 45.7 %   Monocytes Relative 8.2 %   Eosinophils Relative 1.3 %   Basophils Relative 0.6 %  Lipid panel  Result Value Ref Range   Cholesterol 174 <200 mg/dL   HDL 45 (L) > OR = 50 mg/dL   Triglycerides 104 <150 mg/dL   LDL Cholesterol (Calc) 109 (H) mg/dL (calc)   Total CHOL/HDL Ratio 3.9 <5.0 (calc)   Non-HDL Cholesterol (Calc) 129 <130 mg/dL (calc)      Assessment & Plan:   Problem List Items Addressed This Visit    Obesity (BMI 30.0-34.9)    Encouragement for Diet exercise lifestyle goal for weight loss       Essential hypertension - Primary    Well-controlled HTN - Home BP readings none  No known complications    Plan:  1. Continue current BP regimen - Lisinopril 20-25mg  daily REFILL 2.  Encourage improved lifestyle - low sodium diet, regular exercise 3. Continue monitor BP outside office, bring readings to next visit, if persistently >140/90 or new symptoms notify office sooner      Relevant Medications   lisinopril-hydrochlorothiazide (ZESTORETIC) 20-25 MG tablet   Dyslipidemia (high LDL; low HDL)    Mildly abnormal lipids last visit 04/2019 Not indicated for Statin ASCVD therapy based on cardiovascular risk Will re-check lipids in 6 months  Encourage improved lifestyle - low carb/cholesterol, reduce portion size, continue improving regular exercise           Meds ordered this encounter  Medications  . lisinopril-hydrochlorothiazide (ZESTORETIC) 20-25 MG tablet    Sig: Take 1 tablet by mouth daily.    Dispense:  90 tablet    Refill:  3    Follow up plan: Return in about 6 months (around 05/04/2020) for 6 month follow-up HTN .  Future labs 04/2020  Nobie Putnam, [facility] 11/02/2019, 11:53 AM

## 2019-11-03 ENCOUNTER — Other Ambulatory Visit: Payer: Self-pay | Admitting: Family Medicine

## 2019-11-03 DIAGNOSIS — E669 Obesity, unspecified: Secondary | ICD-10-CM

## 2019-11-03 DIAGNOSIS — R7309 Other abnormal glucose: Secondary | ICD-10-CM

## 2019-11-03 DIAGNOSIS — E785 Hyperlipidemia, unspecified: Secondary | ICD-10-CM

## 2019-11-03 DIAGNOSIS — I10 Essential (primary) hypertension: Secondary | ICD-10-CM

## 2019-11-03 NOTE — Assessment & Plan Note (Signed)
Mildly abnormal lipids last visit 04/2019 Not indicated for Statin ASCVD therapy based on cardiovascular risk Will re-check lipids in 6 months  Encourage improved lifestyle - low carb/cholesterol, reduce portion size, continue improving regular exercise

## 2019-11-03 NOTE — Assessment & Plan Note (Signed)
Encouragement for Diet exercise lifestyle goal for weight loss

## 2019-11-03 NOTE — Assessment & Plan Note (Signed)
Well-controlled HTN °- Home BP readings none  °No known complications  ° ° °Plan:  °1. Continue current BP regimen - Lisinopril 20-25mg daily REFILL °2. Encourage improved lifestyle - low sodium diet, regular exercise °3. Continue monitor BP outside office, bring readings to next visit, if persistently >140/90 or new symptoms notify office sooner °

## 2020-01-05 ENCOUNTER — Ambulatory Visit (INDEPENDENT_AMBULATORY_CARE_PROVIDER_SITE_OTHER): Payer: Medicare Other

## 2020-01-05 ENCOUNTER — Other Ambulatory Visit: Payer: Self-pay

## 2020-01-05 DIAGNOSIS — Z23 Encounter for immunization: Secondary | ICD-10-CM

## 2020-01-05 DIAGNOSIS — M898X9 Other specified disorders of bone, unspecified site: Secondary | ICD-10-CM | POA: Diagnosis not present

## 2020-01-05 DIAGNOSIS — M778 Other enthesopathies, not elsewhere classified: Secondary | ICD-10-CM | POA: Diagnosis not present

## 2020-01-05 DIAGNOSIS — M2042 Other hammer toe(s) (acquired), left foot: Secondary | ICD-10-CM | POA: Diagnosis not present

## 2020-01-05 DIAGNOSIS — Q6689 Other  specified congenital deformities of feet: Secondary | ICD-10-CM | POA: Diagnosis not present

## 2020-01-26 ENCOUNTER — Telehealth: Payer: Self-pay

## 2020-01-26 NOTE — Telephone Encounter (Signed)
As per Dr Raliegh Ip advised patient to check her B/P while having headache and also keep record for her reading and appointment is scheduled for 01/31/2020 on Tuesday around 10:40 am.

## 2020-01-26 NOTE — Telephone Encounter (Signed)
Copied from [place] [id number]. Topic: General - Other >> Jan 25, 2020  2:53 PM Leward Quan A wrote: Reason for CRM: Patient called to inform Dr Raliegh Ip that she have been waking up with a headache like a migraine no dizziness but just feel the pain upon waking for about a week. Asking Dr Raliegh Ip if there is something that can be done state that she have not been checking her BP when she feel this so not sure if BP is elevated or not. Please advise Ph# [phone]

## 2020-01-31 ENCOUNTER — Encounter: Payer: Self-pay | Admitting: Family Medicine

## 2020-01-31 ENCOUNTER — Other Ambulatory Visit: Payer: Self-pay

## 2020-01-31 ENCOUNTER — Ambulatory Visit (INDEPENDENT_AMBULATORY_CARE_PROVIDER_SITE_OTHER): Payer: Medicare Other | Admitting: Family Medicine

## 2020-01-31 VITALS — BP 127/69 | HR 73 | Temp 97.3°F | Resp 16 | Ht 62.0 in | Wt 171.0 lb

## 2020-01-31 DIAGNOSIS — J0110 Acute frontal sinusitis, unspecified: Secondary | ICD-10-CM

## 2020-01-31 MED ORDER — FLUTICASONE PROPIONATE 50 MCG/ACT NA SUSP: 2.0000 | Freq: Every day | NASAL | 3 refills | Status: DC

## 2020-01-31 MED ORDER — AMOXICILLIN-POT CLAVULANATE 875-125 MG PO TABS: 1.0000 | ORAL_TABLET | Freq: Two times a day (BID) | ORAL | 0 refills | Status: DC

## 2020-01-31 NOTE — Progress Notes (Signed)
Subjective:    Patient ID: Brittney Schmidt, female    DOB: 28-Apr-1950, 69 y.o.   MRN: [id number]  Brittney Schmidt is a 69 y.o. female presenting on 01/31/2020 for Headache (onset week --when she wakes up as per patient )   HPI   Sinusitis / Sinus Pressure Frontal / Headaches Reports onset 2 weeks ago, onset bilateral temples and forehead, with some associated dizziness, usually improves when gets up. Usually headache is moderate to to severe, some days more mild. Seems to ease off in afternoon. She thought maybe cleaning spray she stopped using it. - Takes Tylenol 500mg  1-2 times a day PRN with some relief - Not taking Ibuprofen Advil NSAIDs - Not taking Excedrin or Goody powder Admits eye burning with watery dry eyes Denies any loss of vision or blurry vision  CHRONIC HTN Reportsno new concern. She is checking BP occasionally, doing well Needs refill today Current Meds -Lisinopril-HCTZ 20-25mg  daily Reports good compliance, took meds today. Tolerating well, w/o complaints. - She is active, cleaning, shopping. Caring for grandchildren at times. - Diet:balanced, not particular diet, limits salt - Exercise:trying now to do more regular walking for exercise Denies CP, dyspnea, HA, edema, dizziness / lightheadedness   Health Maintenance: UTD Vaccines  Depression screen [facility] 2/9 11/02/2019 07/15/2019 05/09/2019  Decreased Interest 0 0 0  Down, Depressed, Hopeless 0 0 0  PHQ - 2 Score 0 0 0  Altered sleeping - - -  Difficult doing work/chores - - -    Social History   Tobacco Use  . Smoking status: Never Smoker  . Smokeless tobacco: Never Used  Vaping Use  . Vaping Use: Never used  Substance Use Topics  . Alcohol use: Yes    Alcohol/week: 2.0 standard drinks    Types: 2 Cans of beer per week    Comment: occasional, planning to quit completely  . Drug use: No    Review of Systems Per HPI unless specifically indicated above     Objective:    BP 127/69   Pulse  73   Temp (!) 97.3 F (36.3 C) (Temporal)   Resp 16   Ht 5\' 2"  (1.575 m)   Wt 171 lb (77.6 kg)   SpO2 99%   BMI 31.28 kg/m   Wt Readings from Last 3 Encounters:  01/31/20 171 lb (77.6 kg)  11/02/19 172 lb (78 kg)  05/09/19 168 lb (76.2 kg)    Physical Exam Vitals and nursing note reviewed.  Constitutional:      General: She is not in acute distress.    Appearance: She is well-developed. She is not diaphoretic.     Comments: Well-appearing, comfortable, cooperative  HENT:     Head: Normocephalic and atraumatic.     Comments: Facial tenderness pressure frontal sinus    Right Ear: Ear canal and external ear normal. There is no impacted cerumen.     Left Ear: Ear canal and external ear normal. There is no impacted cerumen.     Ears:     Comments: Mild clear effusion bilateral TM.    Nose: No congestion or rhinorrhea.     Comments: Turbinates edematous    Mouth/Throat:     Mouth: Mucous membranes are moist.     Pharynx: No oropharyngeal exudate or posterior oropharyngeal erythema.  Eyes:     General:        Right eye: No discharge.        Left eye: No discharge.  Extraocular Movements: Extraocular movements intact.     Conjunctiva/sclera: Conjunctivae normal.     Pupils: Pupils are equal, round, and reactive to light.  Neck:     Thyroid: No thyromegaly.  Cardiovascular:     Rate and Rhythm: Normal rate and regular rhythm.     Heart sounds: Normal heart sounds. No murmur heard.   Pulmonary:     Effort: Pulmonary effort is normal. No respiratory distress.     Breath sounds: Normal breath sounds. No wheezing or rales.  Musculoskeletal:        General: Normal range of motion.     Cervical back: Normal range of motion and neck supple.  Lymphadenopathy:     Cervical: No cervical adenopathy.  Skin:    General: Skin is warm and dry.     Findings: No erythema or rash.  Neurological:     Mental Status: She is alert and oriented to person, place, and time.  Psychiatric:         Behavior: Behavior normal.     Comments: Well groomed, good eye contact, normal speech and thoughts       Results for orders placed or performed in visit on 05/02/19  Comprehensive metabolic panel  Result Value Ref Range   Glucose, Bld 107 (H) 65 - 99 mg/dL   BUN 12 7 - 25 mg/dL   Creat 0.80 0.50 - 0.99 mg/dL   BUN/Creatinine Ratio NOT APPLICABLE 6 - 22 (calc)   Sodium 142 135 - 146 mmol/L   Potassium 3.5 3.5 - 5.3 mmol/L   Chloride 105 98 - 110 mmol/L   CO2 29 20 - 32 mmol/L   Calcium 9.2 8.6 - 10.4 mg/dL   Total Protein 6.5 6.1 - 8.1 g/dL   Albumin 4.0 3.6 - 5.1 g/dL   Globulin 2.5 1.9 - 3.7 g/dL (calc)   AG Ratio 1.6 1.0 - 2.5 (calc)   Total Bilirubin 0.8 0.2 - 1.2 mg/dL   Alkaline phosphatase (APISO) 47 37 - 153 U/L   AST 15 10 - 35 U/L   ALT 13 6 - 29 U/L  Hemoglobin A1c  Result Value Ref Range   Hgb A1c MFr Bld 5.3 <5.7 % of total Hgb   Mean Plasma Glucose 105 (calc)   eAG (mmol/L) 5.8 (calc)  CBC with Differential/Platelet  Result Value Ref Range   WBC 4.6 3.8 - 10.8 Thousand/uL   RBC 4.93 3.80 - 5.10 Million/uL   Hemoglobin 13.5 11.7 - 15.5 g/dL   HCT 41.2 35 - 45 %   MCV 83.6 80.0 - 100.0 fL   MCH 27.4 27.0 - 33.0 pg   MCHC 32.8 32.0 - 36.0 g/dL   RDW 12.3 11.0 - 15.0 %   Platelets 205 140 - 400 Thousand/uL   MPV 10.8 7.5 - 12.5 fL   Neutro Abs 2,033 1,500 - 7,800 cells/uL   Lymphs Abs 2,102 850 - 3,900 cells/uL   Absolute Monocytes 377 200 - 950 cells/uL   Eosinophils Absolute 60 15.0 - 500.0 cells/uL   Basophils Absolute 28 0.0 - 200.0 cells/uL   Neutrophils Relative % 44.2 %   Total Lymphocyte 45.7 %   Monocytes Relative 8.2 %   Eosinophils Relative 1.3 %   Basophils Relative 0.6 %  Lipid panel  Result Value Ref Range   Cholesterol 174 <200 mg/dL   HDL 45 (L) > OR = 50 mg/dL   Triglycerides 104 <150 mg/dL   LDL Cholesterol (Calc) 109 (H) mg/dL (calc)  Total CHOL/HDL Ratio 3.9 <5.0 (calc)   Non-HDL Cholesterol (Calc) 129 <130 mg/dL (calc)        Assessment & Plan:   Problem List Items Addressed This Visit    None    Visit Diagnoses    Acute non-recurrent frontal sinusitis    -  Primary   Relevant Medications   fluticasone (FLONASE) 50 MCG/ACT nasal spray   amoxicillin-clavulanate (AUGMENTIN) 875-125 MG tablet      Consistent with acute frontal sinusitis, likely initially allergic rhinitis component with worsening concern for bacterial infection. Some improvement overall but still significant sinus pressure pain causing headaches.  Not consistent with HTN, controlled on medicaton  Plan: 1 Start Augmentin 875-125mg  PO BID x 10 days 2. Start Loratadine (Claritin) 10mg  daily and Flonase 2 sprays in each nostril daily for next 4-6 weeks, then may stop and use seasonally or as needed 3. May use oral decongestant 4. Use OTC Tylenol higher dose as needed Return criteria reviewed - future if persistent headache can refer to Neuro for eval if indicated, otherwise now no neurological deficits.    Meds ordered this encounter  Medications  . fluticasone (FLONASE) 50 MCG/ACT nasal spray    Sig: Place 2 sprays into both nostrils daily. Use for 4-6 weeks then stop and use seasonally or as needed.    Dispense:  16 g    Refill:  3  . amoxicillin-clavulanate (AUGMENTIN) 875-125 MG tablet    Sig: Take 1 tablet by mouth 2 (two) times daily.    Dispense:  20 tablet    Refill:  0      Follow up plan: Return in about 1 week (around 02/07/2020), or if symptoms worsen or fail to improve.   Brittney Schmidt, [facility] 01/31/2020, 11:17 AM

## 2020-01-31 NOTE — Patient Instructions (Addendum)
1. It sounds like you have persistent Sinus Congestion or "Rhinosinusitis" - I do not think that this is a Bacterial Sinus Infection. Usually these are caused by Viruses or Allergies, and will run it's course in about 7 to 10 days. - No antibiotics are needed - . It sounds like you have a Sinusitis (Bacterial Infection) - this most likely started as an Upper Respiratory Virus that has settled into an infection. Allergies can also cause this. - Start Augmentin 1 pill twice daily (breakfast and dinner, with food and plenty of water) for 10 days, complete entire course, do not stop early even if feeling better - Start OTC Loratadine (Claritin) 10mg  daily and Flonase 2 sprays in each nostril daily for next 4-6 weeks, then you may stop and use seasonally or as needed - Recommend to start using Nasal Saline spray multiple times a day to help flush out congestion and clear sinuses - Improve hydration by drinking plenty of clear fluids (water, gatorade) to reduce secretions and thin congestion - Congestion draining down throat can cause irritation. May try warm herbal tea with honey, cough drops - Can take Tylenol or Ibuprofen as needed for fevers - May continue over the counter cold medicine as you are, I would not use any decongestant or mucinex longer than 7 days.  If you develop persistent fever >101F for at least 3 consecutive days, headaches with sinus pain or pressure or persistent earache, please schedule a follow-up evaluation within next few days to week.  Please schedule a Follow-up Appointment to: Return in about 1 week (around 02/07/2020), or if symptoms worsen or fail to improve.  If you have any other questions or concerns, please feel free to call the office or send a message through [facility]. You may also schedule an earlier appointment if necessary.  Additionally, you may be receiving a survey about your experience at our office within a few days to 1 week by e-mail or mail. We value your  feedback.  Nobie Putnam, DO [place]

## 2020-03-30 ENCOUNTER — Telehealth: Payer: Self-pay

## 2020-03-30 NOTE — Telephone Encounter (Signed)
Patient's virtual apt is scheduled on 04/14/2020 around 2:20 pm

## 2020-03-30 NOTE — Telephone Encounter (Signed)
Copied from [place] [phone] [phone]. Topic: General - Other >> Mar 30, 2020  2:41 PM Rainey Pines A wrote: Patient is requesting a callback from Dr. Eustaquio Boyden nurse in regards to having cough syrup called in to pharmacy. Please advise

## 2020-04-03 ENCOUNTER — Encounter: Payer: Self-pay | Admitting: Family Medicine

## 2020-04-03 ENCOUNTER — Telehealth (INDEPENDENT_AMBULATORY_CARE_PROVIDER_SITE_OTHER): Payer: Medicare Other | Admitting: Family Medicine

## 2020-04-03 ENCOUNTER — Other Ambulatory Visit: Payer: Self-pay

## 2020-04-03 VITALS — Temp 98.1°F | Ht 62.0 in | Wt 170.0 lb

## 2020-04-03 DIAGNOSIS — J0110 Acute frontal sinusitis, unspecified: Secondary | ICD-10-CM

## 2020-04-03 DIAGNOSIS — R059 Cough, unspecified: Secondary | ICD-10-CM

## 2020-04-03 MED ORDER — GUAIFENESIN-CODEINE 100-10 MG/5ML PO SYRP: 5.0000 mL | ORAL_SOLUTION | Freq: Four times a day (QID) | ORAL | 0 refills | Status: DC | PRN

## 2020-04-03 NOTE — Progress Notes (Signed)
Virtual Visit via Telephone The purpose of this virtual visit is to provide medical care while limiting exposure to the novel coronavirus (COVID19) for both patient and office staff.  Consent was obtained for phone visit:  Yes.   Answered questions that patient had about telehealth interaction:  Yes.   I discussed the limitations, risks, security and privacy concerns of performing an evaluation and management service by telephone. I also discussed with the patient that there may be a patient responsible charge related to this service. The patient expressed understanding and agreed to proceed.  Patient Location: Home Provider Location: [facility] (Office)  Participants in virtual visit: - Patient: Brittney Schmidt. Coy Saunas - CMA: Frederich Cha, CMA - Provider: Dr Parks Ranger  ---------------------------------------------------------------------- Chief Complaint  Patient presents with  . Cough    She states it's been going on for about a week.  . Diarrhea    Lasted a day or so.    S: Reviewed CMA documentation. I have called patient and gathered additional HPI as follows:  COUGH / URI / Possible COVID19 Reports that symptoms started about 1 week ago with coughing mostly dry with sinus pressure and congestion, often wakes up at night with coughing. She has used cough syrup in past with relief, asking about covid testing and treatment. Now today she admits coughing seems to improve. - Tried OTC Cough medicine delsym - She is using Flonase nasal spray regularly  Denies any fevers, chills, sweats, body ache, shortness of breath, sinus pain or pressure, headache, abdominal pain, diarrhea  Past Medical History:  Diagnosis Date  . Ankle swelling   . Hypertension   . Wears dentures    full upper, partial lower (doesn't wear lower)   Social History   Tobacco Use  . Smoking status: Never Smoker  . Smokeless tobacco: Never Used  Vaping Use  . Vaping Use: Never used   Substance Use Topics  . Alcohol use: Yes    Alcohol/week: 2.0 standard drinks    Types: 2 Cans of beer per week    Comment: occasional, planning to quit completely  . Drug use: No    Current Outpatient Medications:  .  aspirin 81 MG tablet, Take 81 mg by mouth daily., Disp: , Rfl:  .  fluticasone (FLONASE) 50 MCG/ACT nasal spray, Place 2 sprays into both nostrils daily. Use for 4-6 weeks then stop and use seasonally or as needed., Disp: 16 g, Rfl: 3 .  guaiFENesin-codeine (ROBITUSSIN AC) 100-10 MG/5ML syrup, Take 5-10 mLs by mouth 4 (four) times daily as needed for cough., Disp: 180 mL, Rfl: 0 .  lisinopril-hydrochlorothiazide (ZESTORETIC) 20-25 MG tablet, Take 1 tablet by mouth daily., Disp: 90 tablet, Rfl: 3  Depression screen [facility] 2/9 11/02/2019 07/15/2019 05/09/2019  Decreased Interest 0 0 0  Down, Depressed, Hopeless 0 0 0  PHQ - 2 Score 0 0 0  Altered sleeping - - -  Difficult doing work/chores - - -    No flowsheet data found.  -------------------------------------------------------------------------- O: No physical exam performed due to remote telephone encounter.  Lab results reviewed.  No results found for this or any previous visit (from the past 2160 hour(s)).  -------------------------------------------------------------------------- A&P:  Problem List Items Addressed This Visit   None   Visit Diagnoses    Cough    -  Primary   Relevant Medications   guaiFENesin-codeine (ROBITUSSIN AC) 100-10 MG/5ML syrup   Acute non-recurrent frontal sinusitis       Relevant Medications   guaiFENesin-codeine (  ROBITUSSIN AC) 100-10 MG/5ML syrup     Cough / Viral URI vs COVID suspected Mild symptoms No known respiratory condition to cause complication Improving now Residual cough without dyspnea UTD COVID Vaccine except missing booster, last dose 06/2019  Trial on Virtussin Cough Syrup with codeine she has taken before, sent to pharmacy Use OTC cough / sinus meds as  needed CONTINUE nasal steroid Flonase 2 sprays in each nostril daily for 4-6 weeks, may repeat course seasonally or as needed   Recommend COVID testing in community as planned, gave her options  Follow-up as needed  Meds ordered this encounter  Medications  . guaiFENesin-codeine (ROBITUSSIN AC) 100-10 MG/5ML syrup    Sig: Take 5-10 mLs by mouth 4 (four) times daily as needed for cough.    Dispense:  180 mL    Refill:  0    Follow-up: - Return in 1 week as needed if worsening cough or unresolved  Patient verbalizes understanding with the above medical recommendations including the limitation of remote medical advice.  Specific follow-up and call-back criteria were given for patient to follow-up or seek medical care more urgently if needed.   - Time spent in direct consultation with patient on phone: 7 minutes   Nobie Putnam, [facility] 04/03/2020, 2:46 PM

## 2020-04-03 NOTE — Patient Instructions (Addendum)
Trial on Virtussin Cough Syrup with codeine she has taken before, sent to pharmacy Use OTC cough / sinus meds as needed CONTINUE nasal steroid Flonase 2 sprays in each nostril daily for 4-6 weeks, may repeat course seasonally or as needed   Recommend COVID testing in community as planned, gave her options  Follow-up as needed  Please schedule a Follow-up Appointment to: Return if symptoms worsen or fail to improve.  If you have any other questions or concerns, please feel free to call the office or send a message through [place]. You may also schedule an earlier appointment if necessary.  Additionally, you may be receiving a survey about your experience at our office within a few days to 1 week by e-mail or mail. We value your feedback.  Nobie Putnam, DO [place]

## 2020-04-27 DIAGNOSIS — M2012 Hallux valgus (acquired), left foot: Secondary | ICD-10-CM | POA: Diagnosis not present

## 2020-04-27 DIAGNOSIS — Q6689 Other  specified congenital deformities of feet: Secondary | ICD-10-CM | POA: Diagnosis not present

## 2020-04-27 DIAGNOSIS — M2011 Hallux valgus (acquired), right foot: Secondary | ICD-10-CM | POA: Diagnosis not present

## 2020-04-27 DIAGNOSIS — M2042 Other hammer toe(s) (acquired), left foot: Secondary | ICD-10-CM | POA: Diagnosis not present

## 2020-04-27 DIAGNOSIS — M778 Other enthesopathies, not elsewhere classified: Secondary | ICD-10-CM | POA: Diagnosis not present

## 2020-04-27 DIAGNOSIS — M898X9 Other specified disorders of bone, unspecified site: Secondary | ICD-10-CM | POA: Diagnosis not present

## 2020-05-18 ENCOUNTER — Ambulatory Visit (INDEPENDENT_AMBULATORY_CARE_PROVIDER_SITE_OTHER): Payer: Medicare Other | Admitting: Family Medicine

## 2020-05-18 ENCOUNTER — Encounter: Payer: Self-pay | Admitting: Family Medicine

## 2020-05-18 ENCOUNTER — Other Ambulatory Visit: Payer: Self-pay

## 2020-05-18 VITALS — BP 130/67 | HR 70 | Ht 62.0 in | Wt 166.8 lb

## 2020-05-18 DIAGNOSIS — G43009 Migraine without aura, not intractable, without status migrainosus: Secondary | ICD-10-CM | POA: Diagnosis not present

## 2020-05-18 MED ORDER — SUMATRIPTAN SUCCINATE 50 MG PO TABS: 50.0000 mg | ORAL_TABLET | Freq: Once | ORAL | 2 refills | Status: DC | PRN

## 2020-05-18 NOTE — Progress Notes (Signed)
Subjective:    Patient ID: Brittney Schmidt, female    DOB: [birth year], 70 y.o.   MRN: [id number]  Brittney Schmidt is a 70 y.o. female presenting on 05/18/2020 for Headache   HPI   Headaches Variable sensation with headache on L or R episodic past few days worse, with weather change was worse. History of sinusitis but now no congestion or sinus symptoms. She is on Flonase regularly and it helps. Admits some nausea or dizzy with headaches. No prior regular history of migraine headaches BP is controlled on medication Denies any numbness tingling or weakness Denies loss of vision   Depression screen [facility] 2/9 11/02/2019 07/15/2019 05/09/2019  Decreased Interest 0 0 0  Down, Depressed, Hopeless 0 0 0  PHQ - 2 Score 0 0 0  Altered sleeping - - -  Difficult doing work/chores - - -    Social History   Tobacco Use  . Smoking status: Never Smoker  . Smokeless tobacco: Never Used  Vaping Use  . Vaping Use: Never used  Substance Use Topics  . Alcohol use: Yes    Alcohol/week: 2.0 standard drinks    Types: 2 Cans of beer per week    Comment: occasional, planning to quit completely  . Drug use: No    Review of Systems Per HPI unless specifically indicated above     Objective:    BP 130/67   Pulse 70   Ht 5\' 2"  (1.575 m)   Wt 166 lb 12.8 oz (75.7 kg)   SpO2 99%   BMI 30.51 kg/m   Wt Readings from Last 3 Encounters:  05/18/20 166 lb 12.8 oz (75.7 kg)  04/03/20 170 lb (77.1 kg)  01/31/20 171 lb (77.6 kg)    Physical Exam Vitals and nursing note reviewed.  Constitutional:      General: She is not in acute distress.    Appearance: She is well-developed and well-nourished. She is not diaphoretic.     Comments: Well-appearing, comfortable, cooperative  HENT:     Head: Normocephalic and atraumatic.     Mouth/Throat:     Mouth: Oropharynx is clear and moist.  Eyes:     General:        Right eye: No discharge.        Left eye: No discharge.     Conjunctiva/sclera:  Conjunctivae normal.  Neck:     Vascular: No carotid bruit.  Cardiovascular:     Rate and Rhythm: Normal rate and regular rhythm.     Pulses: Normal pulses.     Heart sounds: Normal heart sounds. No murmur heard.   Pulmonary:     Effort: Pulmonary effort is normal.     Breath sounds: Normal breath sounds. No stridor. No wheezing, rhonchi or rales.  Musculoskeletal:        General: No edema.     Right lower leg: No edema.     Left lower leg: No edema.  Skin:    General: Skin is warm and dry.     Findings: No erythema or rash.  Neurological:     General: No focal deficit present.     Mental Status: She is alert. Mental status is at baseline. She is disoriented.     Cranial Nerves: No cranial nerve deficit.     Sensory: No sensory deficit.     Motor: No weakness.  Psychiatric:        Mood and Affect: Mood and affect normal.  Behavior: Behavior normal.     Comments: Well groomed, good eye contact, normal speech and thoughts    Results for orders placed or performed in visit on 05/02/19  Comprehensive metabolic panel  Result Value Ref Range   Glucose, Bld 107 (H) 65 - 99 mg/dL   BUN 12 7 - 25 mg/dL   Creat 0.80 0.50 - 0.99 mg/dL   BUN/Creatinine Ratio NOT APPLICABLE 6 - 22 (calc)   Sodium 142 135 - 146 mmol/L   Potassium 3.5 3.5 - 5.3 mmol/L   Chloride 105 98 - 110 mmol/L   CO2 29 20 - 32 mmol/L   Calcium 9.2 8.6 - 10.4 mg/dL   Total Protein 6.5 6.1 - 8.1 g/dL   Albumin 4.0 3.6 - 5.1 g/dL   Globulin 2.5 1.9 - 3.7 g/dL (calc)   AG Ratio 1.6 1.0 - 2.5 (calc)   Total Bilirubin 0.8 0.2 - 1.2 mg/dL   Alkaline phosphatase (APISO) 47 37 - 153 U/L   AST 15 10 - 35 U/L   ALT 13 6 - 29 U/L  Hemoglobin A1c  Result Value Ref Range   Hgb A1c MFr Bld 5.3 <5.7 % of total Hgb   Mean Plasma Glucose 105 (calc)   eAG (mmol/L) 5.8 (calc)  CBC with Differential/Platelet  Result Value Ref Range   WBC 4.6 3.8 - 10.8 Thousand/uL   RBC 4.93 3.80 - 5.10 Million/uL   Hemoglobin 13.5  11.7 - 15.5 g/dL   HCT 41.2 35.0 - 45.0 %   MCV 83.6 80.0 - 100.0 fL   MCH 27.4 27.0 - 33.0 pg   MCHC 32.8 32.0 - 36.0 g/dL   RDW 12.3 11.0 - 15.0 %   Platelets 205 140 - 400 Thousand/uL   MPV 10.8 7.5 - 12.5 fL   Neutro Abs 2,033 1,500 - 7,800 cells/uL   Lymphs Abs 2,102 850 - 3,900 cells/uL   Absolute Monocytes 377 200 - 950 cells/uL   Eosinophils Absolute 60 15 - 500 cells/uL   Basophils Absolute 28 0 - 200 cells/uL   Neutrophils Relative % 44.2 %   Total Lymphocyte 45.7 %   Monocytes Relative 8.2 %   Eosinophils Relative 1.3 %   Basophils Relative 0.6 %  Lipid panel  Result Value Ref Range   Cholesterol 174 <200 mg/dL   HDL 45 (L) > OR = 50 mg/dL   Triglycerides 104 <150 mg/dL   LDL Cholesterol (Calc) 109 (H) mg/dL (calc)   Total CHOL/HDL Ratio 3.9 <5.0 (calc)   Non-HDL Cholesterol (Calc) 129 <130 mg/dL (calc)      Assessment & Plan:   Problem List Items Addressed This Visit   None   Visit Diagnoses    Migraine without aura and without status migrainosus, not intractable    -  Primary   Relevant Medications   SUMAtriptan (IMITREX) 50 MG tablet      Clinically headache syndrome now very suspicious for mgiraine, however concerning she has no regular migraine history, has had headaches in past. Weather related seems common trigger at this time Variable headache has been R and L at times, episodic, some nausea and associated features consistent with migraine Limited trial of medication at this time Trial OTC, on ASA daily  Plan: 1. Start abortive therapy with Sumatriptan 50mg  tabs - take 1 PRN (#12, 0 refill due to quantity limit), severe HA, may repeat dose within 2 hr if persistent, no more in 24 hours, in future can titrate dose to 50-100mg  if  needed. Counseling on potential side effect / intolerance with chest discomfort acutely after taking sumatriptan 2. May take Tylenol / Excedrin Migraine, caution rebound 3. Avoid triggers including foods, caffeine. Important  to rest. Return criteria given for acute migraine, when to go to office vs ED  Future consider head imaging if pattern of headache changes or is refractory vs refer to Neuro if indicated.  Meds ordered this encounter  Medications  . SUMAtriptan (IMITREX) 50 MG tablet    Sig: Take 1 tablet (50 mg total) by mouth once as needed for up to 1 dose for migraine. May repeat one dose in 2 hours if headache persists, for max dose 24 hours    Dispense:  12 tablet    Refill:  2      Follow up plan: Return in about 1 week (around 05/25/2020), or if symptoms worsen or fail to improve, for headache.   Nobie Putnam, [facility] 05/18/2020, 11:58 AM

## 2020-05-18 NOTE — Patient Instructions (Addendum)
Thank you for coming to the office today.  1. You most likely have Chronic Migraine Headaches - Migraine headaches present differently for many patients, pain is usually throbbing or aching, often on one side of head or behind the eye. They tend to last for up to hours or days. In treating migraines, our goal is to 1) stop the headache and 2) prevent recurrence of headaches  Treatment to STOP the headache at this time: - Start with Sumatriptan 50mg  - take 1 immediately at onset of moderate to severe migraine headache, if unresolved or return within 2 hours then repeat dose 1 tablet, that is max dose for 24 hours. (Note - this medication can cause a brief episode of flushing and chest pressure or pain very soon after taking it. That is NORMAL, and it is the medicine taking effect and dilating some blood vessels. It should pass, and resolve within seconds to minutes after - it may not happen at all)  - Try this for 1-2 weeks, if absolutely NOT helping then contact me to discuss changes, possibly can double sumatriptan dose or try nasal spray - You can still take over the counter meds with Ibuprofen up to 600-800mg  per dose 3 times a day with food for a few days and may try Excedrin Migraine as needed, ONLY use these after you have tried Sumatriptan up to 2 doses, and try to limit their use if they are not effective  Treatment to PREVENT headaches: - Goal is to avoid triggers. We need to learn more details on what are your exact or possible headache triggers first. - Keep detailed headache diary (on printed handout) for possible triggers, bring this to your next visit to discuss further - Known possible triggers include caffeine, chocolate, alcohol, stress, weather changes, menstrual cycle, certain other foods - Also be aware that OTC pain meds/anti-inflammatories can cause rebound headache, they help resolve the headache but then after the effect wears off they can CAUSE a headache. Try to taper down  and stop these medications and allow them to get out of your system for 1-2 weeks    Please schedule a Follow-up Appointment to: Return in about 1 week (around 05/25/2020), or if symptoms worsen or fail to improve, for headache.  If you have any other questions or concerns, please feel free to call the office or send a message through [place]. You may also schedule an earlier appointment if necessary.  Additionally, you may be receiving a survey about your experience at our office within a few days to 1 week by e-mail or mail. We value your feedback.  Nobie Putnam, DO [place]

## 2020-06-19 ENCOUNTER — Ambulatory Visit (INDEPENDENT_AMBULATORY_CARE_PROVIDER_SITE_OTHER): Payer: Medicare Other

## 2020-06-19 VITALS — Ht 62.0 in | Wt 169.0 lb

## 2020-06-19 DIAGNOSIS — Z0000 Encounter for general adult medical examination without abnormal findings: Secondary | ICD-10-CM | POA: Diagnosis not present

## 2020-06-19 NOTE — Progress Notes (Signed)
I connected with Brittney Schmidt today by telephone and verified that I am speaking with the correct person using two identifiers. Location patient: home Location provider: work Persons participating in the virtual visit: Clea Dubach, Glenna Durand LPN.   I discussed the limitations, risks, security and privacy concerns of performing an evaluation and management service by telephone and the availability of in person appointments. I also discussed with the patient that there may be a patient responsible charge related to this service. The patient expressed understanding and verbally consented to this telephonic visit.    Interactive audio and video telecommunications were attempted between this provider and patient, however failed, due to patient having technical difficulties OR patient did not have access to video capability.  We continued and completed visit with audio only.     Vital signs may be patient reported or missing.  Subjective:   Brittney Schmidt is a 70 y.o. female who presents for Medicare Annual (Subsequent) preventive examination.  Review of Systems     Cardiac Risk Factors include: advanced age (>60men, >47 women);diabetes mellitus;hypertension;obesity (BMI >30kg/m2);sedentary lifestyle     Objective:    Today's Vitals   06/19/20 1137  Weight: 169 lb (76.7 kg)  Height: 5\' 2"  (1.575 m)   Body mass index is 30.91 kg/m.  Advanced Directives 06/19/2020 01/18/2019 11/16/2018 04/19/2018 10/28/2017 04/03/2017  Does Patient Have a Medical Advance Directive? No No No No Yes No  Would patient like information on creating a medical advance directive? - No - Patient declined - - - No - Patient declined    Current Medications (verified) Outpatient Encounter Medications as of 06/19/2020  Medication Sig  . aspirin 81 MG tablet Take 81 mg by mouth daily.  . fluticasone (FLONASE) 50 MCG/ACT nasal spray Place 2 sprays into both nostrils daily. Use for 4-6 weeks then stop and use  seasonally or as needed.  Marland Kitchen lisinopril-hydrochlorothiazide (ZESTORETIC) 20-25 MG tablet Take 1 tablet by mouth daily.  . SUMAtriptan (IMITREX) 50 MG tablet Take 1 tablet (50 mg total) by mouth once as needed for up to 1 dose for migraine. May repeat one dose in 2 hours if headache persists, for max dose 24 hours   No facility-administered encounter medications on file as of 06/19/2020.    Allergies (verified) Patient has no known allergies.   History: Past Medical History:  Diagnosis Date  . Ankle swelling   . Hypertension   . Wears dentures    full upper, partial lower (doesn't wear lower)   Past Surgical History:  Procedure Laterality Date  . ABDOMINAL HYSTERECTOMY    . COLONOSCOPY WITH PROPOFOL N/A 01/18/2019   Procedure: COLONOSCOPY WITH PROPOFOL;  Surgeon: Virgel Manifold, MD;  Location: [place];  Service: Endoscopy;  Laterality: N/A;  . POLYPECTOMY  01/18/2019   Procedure: POLYPECTOMY;  Surgeon: Virgel Manifold, MD;  Location: [place];  Service: Endoscopy;;  . TUBAL LIGATION     Family History  Problem Relation Age of Onset  . Diabetes Mother   . Hypertension Mother   . Colon polyps Father   . Heart disease Father   . Heart attack Father   . Cancer Brother   . Hypertension Son   . Breast cancer Neg Hx   . Colon cancer Neg Hx    Social History   Socioeconomic History  . Marital status: Single    Spouse name: Not on file  . Number of children: 3  . Years of education: GED  .  Highest education level: Not on file  Occupational History  . Occupation: unemployed (previously worked as temp)  Tobacco Use  . Smoking status: Never Smoker  . Smokeless tobacco: Never Used  Vaping Use  . Vaping Use: Never used  Substance and Sexual Activity  . Alcohol use: Yes    Alcohol/week: 2.0 standard drinks    Types: 2 Cans of beer per week    Comment: occasional, planning to quit completely  . Drug use: No  . Sexual activity: Not Currently     Partners: Male  Other Topics Concern  . Not on file  Social History Narrative   Has family that lives close by   Social Determinants of Health   Financial Resource Strain: Low Risk   . Difficulty of Paying Living Expenses: Not hard at all  Food Insecurity: No Food Insecurity  . Worried About Charity fundraiser in the Last Year: Never true  . Ran Out of Food in the Last Year: Never true  Transportation Needs: No Transportation Needs  . Lack of Transportation (Medical): No  . Lack of Transportation (Non-Medical): No  Physical Activity: Inactive  . Days of Exercise per Week: 0 days  . Minutes of Exercise per Session: 0 min  Stress: No Stress Concern Present  . Feeling of Stress : Not at all  Social Connections: Not on file    Tobacco Counseling Counseling given: Not Answered   Clinical Intake:  Pre-visit preparation completed: Yes  Pain : No/denies pain     Nutritional Status: BMI > 30  Obese Nutritional Risks: Nausea/ vomitting/ diarrhea (diarrhea may be something she ate, resolved) Diabetes: No  How often do you need to have someone help you when you read instructions, pamphlets, or other written materials from your doctor or pharmacy?: 1 - Never What is the last grade level you completed in school?: 12th grade GED  Diabetic? no  Interpreter Needed?: No  Information entered by :: NAllen LPN   Activities of Daily Living In your present state of health, do you have any difficulty performing the following activities: 06/19/2020 01/31/2020  Hearing? Y N  Comment a little -  Vision? Y N  Comment sometimes in the sunlight -  Difficulty concentrating or making decisions? Y N  Comment has some forgetfulness -  Walking or climbing stairs? N N  Dressing or bathing? N N  Doing errands, shopping? N N  Preparing Food and eating ? N -  Using the Toilet? N -  In the past six months, have you accidently leaked urine? N -  Do you have problems with loss of bowel  control? N -  Managing your Medications? N -  Managing your Finances? N -  Housekeeping or managing your Housekeeping? N -  Some recent data might be hidden    Patient Care Team: Olin Hauser, DO as PCP - General (Family Medicine) Rico Junker, RN as Registered Nurse Theodore Demark, RN as Registered Nurse  Indicate any recent Medical Services you may have received from other than [facility] providers in the past year (date may be approximate).     Assessment:   This is a routine wellness examination for Kimiah.  Hearing/Vision screen No exam data present  Dietary issues and exercise activities discussed: Current Exercise Habits: The patient does not participate in regular exercise at present  Goals    . Patient Stated     06/19/2020, no goals    . Weight (lb) < 200 lb (  90.7 kg)     Wants to be at 150 pounds in 3 months.      Depression Screen PHQ 2/9 Scores 06/19/2020 11/02/2019 07/15/2019 05/09/2019 12/07/2018 11/16/2018 05/31/2018  PHQ - 2 Score 0 0 0 0 1 0 0    Fall Risk Fall Risk  06/19/2020 11/02/2019 07/15/2019 05/09/2019 11/16/2018  Falls in the past year? 0 0 0 0 0  Number falls in past yr: - 0 0 0 -  Injury with Fall? - 0 0 0 -  Risk for fall due to : Medication side effect - - - -  Risk for fall due to: Comment - - - - -  Follow up Falls evaluation completed;Education provided;Falls prevention discussed Falls evaluation completed Falls evaluation completed Falls evaluation completed -    FALL RISK PREVENTION PERTAINING TO THE HOME:  Any stairs in or around the home? No  If so, are there any without handrails? n/a Home free of loose throw rugs in walkways, pet beds, electrical cords, etc? Yes  Adequate lighting in your home to reduce risk of falls? Yes   ASSISTIVE DEVICES UTILIZED TO PREVENT FALLS:  Life alert? No  Use of a cane, walker or w/c? No  Grab bars in the bathroom? No  Shower chair or bench in shower? No  Elevated toilet seat or a handicapped  toilet? No   TIMED UP AND GO:  Was the test performed? No .    Cognitive Function: MMSE - Mini Mental State Exam 10/28/2017  Orientation to time 5  Orientation to Place 5  Registration 3  Attention/ Calculation 5  Recall 1  Language- name 2 objects 2  Language- repeat 1  Language- follow 3 step command 3  Language- read & follow direction 1  Write a sentence 1  Copy design 1  Total score 28     6CIT Screen 06/19/2020 11/16/2018  What Year? 0 points 0 points  What month? 0 points 0 points  What time? 0 points 0 points  Count back from 20 0 points 0 points  Months in reverse 2 points 0 points  Repeat phrase 6 points 0 points  Total Score 8 0    Immunizations Immunization History  Administered Date(s) Administered  . Fluad Quad(high Dose 65+) 12/07/2018, 01/05/2020  . Influenza-Unspecified 04/14/2018  . PFIZER(Purple Top)SARS-COV-2 Vaccination 06/16/2019, 07/12/2019  . Pneumococcal Conjugate-13 10/28/2017  . Pneumococcal Polysaccharide-23 11/16/2018  . Tdap 10/17/2015    TDAP status: Up to date  Flu Vaccine status: Up to date  Pneumococcal vaccine status: Up to date  Covid-19 vaccine status: Completed vaccines  Qualifies for Shingles Vaccine? Yes   Zostavax completed No   Shingrix Completed?: No.    Education has been provided regarding the importance of this vaccine. Patient has been advised to call insurance company to determine out of pocket expense if they have not yet received this vaccine. Advised may also receive vaccine at local pharmacy or Health Dept. Verbalized acceptance and understanding.  Screening Tests Health Maintenance  Topic Date Due  . DEXA SCAN  Never done  . COVID-19 Vaccine (3 - Booster for Pfizer series) 01/11/2020  . Hepatitis C Screening  11/01/2020 (Originally 1950/08/27)  . MAMMOGRAM  09/07/2021  . COLONOSCOPY (Pts 45-64yrs Insurance coverage will need to be confirmed)  01/18/2024  . TETANUS/TDAP  12/22/2025  . INFLUENZA VACCINE   Completed  . PNA vac Low Risk Adult  Completed  . Central  Maintenance Due  Topic Date Due  . DEXA SCAN  Never done  . COVID-19 Vaccine (3 - Booster for Pfizer series) 01/11/2020    Colorectal cancer screening: Type of screening: Colonoscopy. Completed 01/18/2019. Repeat every 5 years  Mammogram status: Completed 09/08/2019. Repeat every year  Bone Density status: due  Lung Cancer Screening: (Low Dose CT Chest recommended if Age 72-80 years, 30 pack-year currently smoking OR have quit w/in 15years.) does not qualify.   Lung Cancer Screening Referral: no  Additional Screening:  Hepatitis C Screening: does qualify; declined  Vision Screening: Recommended annual ophthalmology exams for early detection of glaucoma and other disorders of the eye. Is the patient up to date with their annual eye exam?  No  Who is the provider or what is the name of the office in which the patient attends annual eye exams? [facility] If pt is not established with a provider, would they like to be referred to a provider to establish care? No .   Dental Screening: Recommended annual dental exams for proper oral hygiene  Community Resource Referral / Chronic Care Management: CRR required this visit?  No   CCM required this visit?  No      Plan:     I have personally reviewed and noted the following in the patient's chart:   . Medical and social history . Use of alcohol, tobacco or illicit drugs  . Current medications and supplements . Functional ability and status . Nutritional status . Physical activity . Advanced directives . List of other physicians . Hospitalizations, surgeries, and ER visits in previous 12 months . Vitals . Screenings to include cognitive, depression, and falls . Referrals and appointments  In addition, I have reviewed and discussed with patient certain preventive protocols, quality metrics, and best practice  recommendations. A written personalized care plan for preventive services as well as general preventive health recommendations were provided to patient.     Kellie Simmering, LPN   7/93/9030   Nurse Notes:

## 2020-06-19 NOTE — Patient Instructions (Signed)
Brittney Schmidt , Thank you for taking time to come for your Medicare Wellness Visit. I appreciate your ongoing commitment to your health goals. Please review the following plan we discussed and let me know if I can assist you in the future.   Screening recommendations/referrals: Colonoscopy: completed 01/18/2019, due 01/18/2024 Mammogram: completed 09/08/2019 Bone Density: due Recommended yearly ophthalmology/optometry visit for glaucoma screening and checkup Recommended yearly dental visit for hygiene and checkup  Vaccinations: Influenza vaccine: completed 01/05/2020, due 10/22/2020 Pneumococcal vaccine: completed 11/16/2018 Tdap vaccine: completed 10/17/2015, due 10/16/2025 Shingles vaccine: discussed   Covid-19: 07/12/2019, 06/16/2019  Advanced directives: Advance directive discussed with you today.  Conditions/risks identified: none  Next appointment: Follow up in one year for your annual wellness visit    Preventive Care 65 Years and Older, Female Preventive care refers to lifestyle choices and visits with your health care provider that can promote health and wellness. What does preventive care include?  A yearly physical exam. This is also called an annual well check.  Dental exams once or twice a year.  Routine eye exams. Ask your health care provider how often you should have your eyes checked.  Personal lifestyle choices, including:  Daily care of your teeth and gums.  Regular physical activity.  Eating a healthy diet.  Avoiding tobacco and drug use.  Limiting alcohol use.  Practicing safe sex.  Taking low-dose aspirin every day.  Taking vitamin and mineral supplements as recommended by your health care provider. What happens during an annual well check? The services and screenings done by your health care provider during your annual well check will depend on your age, overall health, lifestyle risk factors, and family history of disease. Counseling  Your health  care provider may ask you questions about your:  Alcohol use.  Tobacco use.  Drug use.  Emotional well-being.  Home and relationship well-being.  Sexual activity.  Eating habits.  History of falls.  Memory and ability to understand (cognition).  Work and work Statistician.  Reproductive health. Screening  You may have the following tests or measurements:  Height, weight, and BMI.  Blood pressure.  Lipid and cholesterol levels. These may be checked every 5 years, or more frequently if you are over 60 years old.  Skin check.  Lung cancer screening. You may have this screening every year starting at age 47 if you have a 30-pack-year history of smoking and currently smoke or have quit within the past 15 years.  Fecal occult blood test (FOBT) of the stool. You may have this test every year starting at age 18.  Flexible sigmoidoscopy or colonoscopy. You may have a sigmoidoscopy every 5 years or a colonoscopy every 10 years starting at age 31.  Hepatitis C blood test.  Hepatitis B blood test.  Sexually transmitted disease (STD) testing.  Diabetes screening. This is done by checking your blood sugar (glucose) after you have not eaten for a while (fasting). You may have this done every 1-3 years.  Bone density scan. This is done to screen for osteoporosis. You may have this done starting at age 91.  Mammogram. This may be done every 1-2 years. Talk to your health care provider about how often you should have regular mammograms. Talk with your health care provider about your test results, treatment options, and if necessary, the need for more tests. Vaccines  Your health care provider may recommend certain vaccines, such as:  Influenza vaccine. This is recommended every year.  Tetanus, diphtheria, and acellular pertussis (  Tdap, Td) vaccine. You may need a Td booster every 10 years.  Zoster vaccine. You may need this after age 50.  Pneumococcal 13-valent conjugate  (PCV13) vaccine. One dose is recommended after age 72.  Pneumococcal polysaccharide (PPSV23) vaccine. One dose is recommended after age 21. Talk to your health care provider about which screenings and vaccines you need and how often you need them. This information is not intended to replace advice given to you by your health care provider. Make sure you discuss any questions you have with your health care provider. Document Released: 04/06/2015 Document Revised: 11/28/2015 Document Reviewed: 01/09/2015 Elsevier Interactive Patient Education  2017 [employer] Prevention in the Home Falls can cause injuries. They can happen to people of all ages. There are many things you can do to make your home safe and to help prevent falls. What can I do on the outside of my home?  Regularly fix the edges of walkways and driveways and fix any cracks.  Remove anything that might make you trip as you walk through a door, such as a raised step or threshold.  Trim any bushes or trees on the path to your home.  Use bright outdoor lighting.  Clear any walking paths of anything that might make someone trip, such as rocks or tools.  Regularly check to see if handrails are loose or broken. Make sure that both sides of any steps have handrails.  Any raised decks and porches should have guardrails on the edges.  Have any leaves, snow, or ice cleared regularly.  Use sand or salt on walking paths during winter.  Clean up any spills in your garage right away. This includes oil or grease spills. What can I do in the bathroom?  Use night lights.  Install grab bars by the toilet and in the tub and shower. Do not use towel bars as grab bars.  Use non-skid mats or decals in the tub or shower.  If you need to sit down in the shower, use a plastic, non-slip stool.  Keep the floor dry. Clean up any water that spills on the floor as soon as it happens.  Remove soap buildup in the tub or shower  regularly.  Attach bath mats securely with double-sided non-slip rug tape.  Do not have throw rugs and other things on the floor that can make you trip. What can I do in the bedroom?  Use night lights.  Make sure that you have a light by your bed that is easy to reach.  Do not use any sheets or blankets that are too big for your bed. They should not hang down onto the floor.  Have a firm chair that has side arms. You can use this for support while you get dressed.  Do not have throw rugs and other things on the floor that can make you trip. What can I do in the kitchen?  Clean up any spills right away.  Avoid walking on wet floors.  Keep items that you use a lot in easy-to-reach places.  If you need to reach something above you, use a strong step stool that has a grab bar.  Keep electrical cords out of the way.  Do not use floor polish or wax that makes floors slippery. If you must use wax, use non-skid floor wax.  Do not have throw rugs and other things on the floor that can make you trip. What can I do with my stairs?  Do  not leave any items on the stairs.  Make sure that there are handrails on both sides of the stairs and use them. Fix handrails that are broken or loose. Make sure that handrails are as long as the stairways.  Check any carpeting to make sure that it is firmly attached to the stairs. Fix any carpet that is loose or worn.  Avoid having throw rugs at the top or bottom of the stairs. If you do have throw rugs, attach them to the floor with carpet tape.  Make sure that you have a light switch at the top of the stairs and the bottom of the stairs. If you do not have them, ask someone to add them for you. What else can I do to help prevent falls?  Wear shoes that:  Do not have high heels.  Have rubber bottoms.  Are comfortable and fit you well.  Are closed at the toe. Do not wear sandals.  If you use a stepladder:  Make sure that it is fully  opened. Do not climb a closed stepladder.  Make sure that both sides of the stepladder are locked into place.  Ask someone to hold it for you, if possible.  Clearly mark and make sure that you can see:  Any grab bars or handrails.  First and last steps.  Where the edge of each step is.  Use tools that help you move around (mobility aids) if they are needed. These include:  Canes.  Walkers.  Scooters.  Crutches.  Turn on the lights when you go into a dark area. Replace any light bulbs as soon as they burn out.  Set up your furniture so you have a clear path. Avoid moving your furniture around.  If any of your floors are uneven, fix them.  If there are any pets around you, be aware of where they are.  Review your medicines with your doctor. Some medicines can make you feel dizzy. This can increase your chance of falling. Ask your doctor what other things that you can do to help prevent falls. This information is not intended to replace advice given to you by your health care provider. Make sure you discuss any questions you have with your health care provider. Document Released: 01/04/2009 Document Revised: 08/16/2015 Document Reviewed: 04/14/2014 Elsevier Interactive Patient Education  2017 [employer].

## 2020-07-18 ENCOUNTER — Ambulatory Visit: Payer: Self-pay

## 2020-07-18 NOTE — Telephone Encounter (Signed)
Balinda Quails, FNP called and says the patient has her first visit with [facility] and she did a [place] PAD screening. She's reporting abnormal findings which has to be reported if less than 0.9. Left foot decreased blood flow 0.88, right foot normal 1.11. She reports patient is asymptomatic with findings.   Reason for Disposition . [1] Other NON-URGENT information for PCP AND [2] does not require PCP response  Answer Assessment - Initial Assessment Questions 1. REASON FOR CALL or QUESTION: "What is your reason for calling today?" or "How can I best help you?" or "What question do you have that I can help answer?"     Notify MD of abnormal findings 2. CALLER: Document the source of call. (e.g., laboratory, patient).     NP with [facility]  Protocols used: PCP CALL - NO TRIAGE-A-AH

## 2020-07-20 NOTE — Telephone Encounter (Signed)
Please review.  KP

## 2020-07-20 NOTE — Telephone Encounter (Signed)
Thank you for update. We can review this at a future appointment with patient. It is fairly common to have some mild abnormal result on that, it could be circulation related but typically is not an urgent concern. Unless she develops symptoms or new concerns.  Nobie Putnam, [facility] 07/20/2020, 11:27 AM

## 2020-08-01 ENCOUNTER — Other Ambulatory Visit: Payer: Self-pay | Admitting: Family Medicine

## 2020-08-01 DIAGNOSIS — Z1231 Encounter for screening mammogram for malignant neoplasm of breast: Secondary | ICD-10-CM

## 2020-09-04 DIAGNOSIS — H5213 Myopia, bilateral: Secondary | ICD-10-CM | POA: Diagnosis not present

## 2020-09-04 DIAGNOSIS — H2513 Age-related nuclear cataract, bilateral: Secondary | ICD-10-CM | POA: Diagnosis not present

## 2020-10-16 DIAGNOSIS — H524 Presbyopia: Secondary | ICD-10-CM | POA: Diagnosis not present

## 2020-11-14 DIAGNOSIS — M898X9 Other specified disorders of bone, unspecified site: Secondary | ICD-10-CM | POA: Diagnosis not present

## 2020-11-14 DIAGNOSIS — M2012 Hallux valgus (acquired), left foot: Secondary | ICD-10-CM | POA: Diagnosis not present

## 2020-11-14 DIAGNOSIS — M2011 Hallux valgus (acquired), right foot: Secondary | ICD-10-CM | POA: Diagnosis not present

## 2020-11-14 DIAGNOSIS — Q6689 Other  specified congenital deformities of feet: Secondary | ICD-10-CM | POA: Diagnosis not present

## 2020-11-14 DIAGNOSIS — M778 Other enthesopathies, not elsewhere classified: Secondary | ICD-10-CM | POA: Diagnosis not present

## 2020-11-14 DIAGNOSIS — M2042 Other hammer toe(s) (acquired), left foot: Secondary | ICD-10-CM | POA: Diagnosis not present

## 2020-12-17 ENCOUNTER — Other Ambulatory Visit: Payer: Self-pay | Admitting: Family Medicine

## 2020-12-17 DIAGNOSIS — I10 Essential (primary) hypertension: Secondary | ICD-10-CM

## 2020-12-18 NOTE — Telephone Encounter (Signed)
Requested medications are due for refill today yes  Requested medications are on the active medication list yes  Last refill 08/08/20  Last visit 11/02/19 (had 3 prn visits for various illnesses)  Future visit scheduled 06/25/21  Notes to clinic Seen in 10/2019 asked to return in 6 months, failed protocol due to visit more than 6 months ago, labs more than 180 days ago, please assess.

## 2021-01-23 ENCOUNTER — Ambulatory Visit: Payer: Medicare Other | Admitting: Family Medicine

## 2021-02-05 ENCOUNTER — Ambulatory Visit: Payer: Medicare Other | Admitting: Family Medicine

## 2021-02-06 ENCOUNTER — Other Ambulatory Visit: Payer: Self-pay | Admitting: Family Medicine

## 2021-02-06 ENCOUNTER — Other Ambulatory Visit: Payer: Self-pay

## 2021-02-06 ENCOUNTER — Ambulatory Visit
Admission: RE | Admit: 2021-02-06 | Discharge: 2021-02-06 | Disposition: A | Discharge status: Home / Self Care | Payer: Medicare Other | Source: Ambulatory Visit | Attending: Family Medicine | Admitting: Family Medicine

## 2021-02-06 DIAGNOSIS — R928 Other abnormal and inconclusive findings on diagnostic imaging of breast: Secondary | ICD-10-CM

## 2021-02-06 DIAGNOSIS — N6320 Unspecified lump in the left breast, unspecified quadrant: Secondary | ICD-10-CM

## 2021-02-06 DIAGNOSIS — Z1231 Encounter for screening mammogram for malignant neoplasm of breast: Secondary | ICD-10-CM | POA: Insufficient documentation

## 2021-02-22 ENCOUNTER — Ambulatory Visit
Admission: RE | Admit: 2021-02-22 | Discharge: 2021-02-22 | Disposition: A | Discharge status: Home / Self Care | Payer: Medicare Other | Source: Ambulatory Visit | Attending: Family Medicine | Admitting: Family Medicine

## 2021-02-22 ENCOUNTER — Other Ambulatory Visit: Payer: Self-pay

## 2021-02-22 DIAGNOSIS — R928 Other abnormal and inconclusive findings on diagnostic imaging of breast: Secondary | ICD-10-CM | POA: Diagnosis not present

## 2021-02-22 DIAGNOSIS — N6320 Unspecified lump in the left breast, unspecified quadrant: Secondary | ICD-10-CM

## 2021-02-22 DIAGNOSIS — R2232 Localized swelling, mass and lump, left upper limb: Secondary | ICD-10-CM | POA: Diagnosis not present

## 2021-02-22 DIAGNOSIS — R922 Inconclusive mammogram: Secondary | ICD-10-CM | POA: Diagnosis not present

## 2021-02-25 ENCOUNTER — Other Ambulatory Visit: Payer: Self-pay | Admitting: Family Medicine

## 2021-02-25 DIAGNOSIS — R2232 Localized swelling, mass and lump, left upper limb: Secondary | ICD-10-CM

## 2021-02-25 DIAGNOSIS — R928 Other abnormal and inconclusive findings on diagnostic imaging of breast: Secondary | ICD-10-CM

## 2021-02-26 DIAGNOSIS — M2011 Hallux valgus (acquired), right foot: Secondary | ICD-10-CM | POA: Diagnosis not present

## 2021-02-26 DIAGNOSIS — M2042 Other hammer toe(s) (acquired), left foot: Secondary | ICD-10-CM | POA: Diagnosis not present

## 2021-02-26 DIAGNOSIS — M2012 Hallux valgus (acquired), left foot: Secondary | ICD-10-CM | POA: Diagnosis not present

## 2021-02-26 DIAGNOSIS — Q6689 Other  specified congenital deformities of feet: Secondary | ICD-10-CM | POA: Diagnosis not present

## 2021-02-26 DIAGNOSIS — M898X9 Other specified disorders of bone, unspecified site: Secondary | ICD-10-CM | POA: Diagnosis not present

## 2021-02-26 DIAGNOSIS — M778 Other enthesopathies, not elsewhere classified: Secondary | ICD-10-CM | POA: Diagnosis not present

## 2021-02-26 DIAGNOSIS — L851 Acquired keratosis [keratoderma] palmaris et plantaris: Secondary | ICD-10-CM | POA: Diagnosis not present

## 2021-03-06 ENCOUNTER — Telehealth: Payer: Self-pay | Admitting: *Deleted

## 2021-03-06 NOTE — Telephone Encounter (Signed)
VM TO PT TO SCHD BREAST BIOPSY ON 12/8 & 12/14

## 2021-03-28 ENCOUNTER — Ambulatory Visit
Admission: RE | Admit: 2021-03-28 | Discharge: 2021-03-28 | Disposition: A | Discharge status: Home / Self Care | Payer: Medicare Other | Source: Ambulatory Visit | Attending: Family Medicine | Admitting: Family Medicine

## 2021-03-28 ENCOUNTER — Other Ambulatory Visit: Payer: Self-pay

## 2021-03-28 DIAGNOSIS — R2232 Localized swelling, mass and lump, left upper limb: Secondary | ICD-10-CM | POA: Diagnosis not present

## 2021-03-28 DIAGNOSIS — L989 Disorder of the skin and subcutaneous tissue, unspecified: Secondary | ICD-10-CM | POA: Diagnosis not present

## 2021-03-28 DIAGNOSIS — C44509 Unspecified malignant neoplasm of skin of other part of trunk: Secondary | ICD-10-CM | POA: Diagnosis not present

## 2021-03-28 DIAGNOSIS — R928 Other abnormal and inconclusive findings on diagnostic imaging of breast: Secondary | ICD-10-CM

## 2021-03-28 DIAGNOSIS — L720 Epidermal cyst: Secondary | ICD-10-CM | POA: Diagnosis not present

## 2021-03-28 DIAGNOSIS — L723 Sebaceous cyst: Secondary | ICD-10-CM | POA: Diagnosis not present

## 2021-04-01 ENCOUNTER — Telehealth: Payer: Self-pay

## 2021-04-01 LAB — SURGICAL PATHOLOGY

## 2021-04-01 NOTE — Telephone Encounter (Signed)
Linda from [facility] called to give results from L breast biopsy. She stats that report was sent to Dr. Raliegh Ip but there was an abscess in the L breast and the Radiologist recommended antibiotics and if pt wanted area removed she would need a Dermatology referral.

## 2021-04-01 NOTE — Telephone Encounter (Signed)
Last time I saw patient was almost 1 year ago.  I see the report with abscess. And radiologist recommends antibiotic and Dermatology referral for removal.  Can we get her scheduled for at least a Virtual Visit or in person visit this week to evaluate it and treat? If need can double book virtual 1-2 days from now for acute.  Nobie Putnam, [facility] 04/01/2021, 2:57 PM

## 2021-04-01 NOTE — Telephone Encounter (Signed)
Left her a message to call and be scheduled.

## 2021-04-03 ENCOUNTER — Telehealth: Payer: Self-pay

## 2021-04-03 ENCOUNTER — Other Ambulatory Visit: Payer: Self-pay

## 2021-04-03 DIAGNOSIS — I10 Essential (primary) hypertension: Secondary | ICD-10-CM

## 2021-04-03 MED ORDER — LISINOPRIL-HYDROCHLOROTHIAZIDE 20-25 MG PO TABS: 1.0000 | ORAL_TABLET | Freq: Every day | ORAL | 0 refills | Status: DC

## 2021-04-03 NOTE — Telephone Encounter (Signed)
Sent!

## 2021-04-03 NOTE — Telephone Encounter (Signed)
Pt at the window requesting a refill on her  BP medication

## 2021-04-05 ENCOUNTER — Ambulatory Visit (INDEPENDENT_AMBULATORY_CARE_PROVIDER_SITE_OTHER): Payer: Medicare Other | Admitting: Family Medicine

## 2021-04-05 ENCOUNTER — Other Ambulatory Visit: Payer: Self-pay

## 2021-04-05 ENCOUNTER — Encounter: Payer: Self-pay | Admitting: Family Medicine

## 2021-04-05 VITALS — BP 136/74 | HR 84 | Ht 62.0 in | Wt 169.2 lb

## 2021-04-05 DIAGNOSIS — R7309 Other abnormal glucose: Secondary | ICD-10-CM | POA: Diagnosis not present

## 2021-04-05 DIAGNOSIS — E785 Hyperlipidemia, unspecified: Secondary | ICD-10-CM

## 2021-04-05 DIAGNOSIS — I10 Essential (primary) hypertension: Secondary | ICD-10-CM | POA: Diagnosis not present

## 2021-04-05 DIAGNOSIS — L02419 Cutaneous abscess of limb, unspecified: Secondary | ICD-10-CM | POA: Diagnosis not present

## 2021-04-05 DIAGNOSIS — E669 Obesity, unspecified: Secondary | ICD-10-CM | POA: Diagnosis not present

## 2021-04-05 DIAGNOSIS — E66811 Obesity, class 1: Secondary | ICD-10-CM

## 2021-04-05 MED ORDER — DOXYCYCLINE HYCLATE 100 MG PO TABS: 100.0000 mg | ORAL_TABLET | Freq: Two times a day (BID) | ORAL | 0 refills | Status: DC

## 2021-04-05 MED ORDER — LISINOPRIL-HYDROCHLOROTHIAZIDE 20-25 MG PO TABS: 1.0000 | ORAL_TABLET | Freq: Every day | ORAL | 3 refills | Status: DC

## 2021-04-05 NOTE — Progress Notes (Signed)
Subjective:    Patient ID: Brittney Schmidt, female    DOB: [birth year], 71 y.o.   MRN: [id number]  Brittney Schmidt is a 71 y.o. female presenting on 04/05/2021 for Cyst and Follow-up   HPI  Left Axillary Abscess vs Cyst Recently had abnormal mammogram and had diagnostic, ultimately went to L breast biopsy and they found cyst with infection abscess, now she is here for evaluation. It has improved swelling down, no spreading redness or drainage. They offered dermatology in future to remove excise if needed. Denies fevers chills  CHRONIC HTN / Obesity BMI >30 Reports no new concern. She is checking BP occasionally, doing well Needs refill today Current Meds - Lisinopril-HCTZ 20-1m daily   Reports good compliance, took meds today. Tolerating well, w/o complaints. Lifestyle: She remains active - Diet: balanced, not particular diet, limits salt - Exercise: trying now to do more regular walking for exercise Denies CP, dyspnea, HA, edema, dizziness / lightheadedness   DYSLIPIDEMIA: - Reports no concerns. Last lipid panel 2021, showed mild elevated LDL and low HDL Not on statin therapy Due for labs today She is not interested in statin at this time Lifestyle See above  Health Maintenance: Due for Flu Shot, will receive today    [facility] 11/02/2019 07/15/2019  Decreased Interest 0 0 0  Down, Depressed, Hopeless 0 0 0  PHQ - 2 Score 0 0 0  Altered sleeping - - -  Difficult doing work/chores - - -    Social History   Tobacco Use   Smoking status: Never   Smokeless tobacco: Never  Vaping Use   Vaping Use: Never used  Substance Use Topics   Alcohol use: Yes    Alcohol/week: 2.0 standard drinks    Types: 2 Cans of beer per week    Comment: occasional, planning to quit completely   Drug use: No    Review of Systems Per HPI unless specifically indicated above     Objective:    BP 136/74    Pulse 84    Ht 5' 2" (1.575 m)    Wt 169 lb 3.2 oz  (76.7 kg)    SpO2 98%    BMI 30.95 kg/m   Wt Readings from Last 3 Encounters:  04/05/21 169 lb 3.2 oz (76.7 kg)  06/19/20 169 lb (76.7 kg)  05/18/20 166 lb 12.8 oz (75.7 kg)    Physical Exam Vitals and nursing note reviewed.  Constitutional:      General: She is not in acute distress.    Appearance: Normal appearance. She is well-developed. She is not diaphoretic.     Comments: Well-appearing, comfortable, cooperative  HENT:     Head: Normocephalic and atraumatic.  Eyes:     General:        Right eye: No discharge.        Left eye: No discharge.     Conjunctiva/sclera: Conjunctivae normal.  Cardiovascular:     Rate and Rhythm: Normal rate.  Pulmonary:     Effort: Pulmonary effort is normal.  Skin:    General: Skin is warm and dry.     Findings: Lesion (left axillary approx 2 cm variable shape mobile deeper nodular density some mild inudration, no ulceration or drainage) present. No erythema or rash.  Neurological:     Mental Status: She is alert and oriented to person, place, and time.  Psychiatric:        Mood and Affect: Mood normal.  Behavior: Behavior normal.        Thought Content: Thought content normal.     Comments: Well groomed, good eye contact, normal speech and thoughts    [place] [facility] L BRST BX W LOC DEV 1S 03/28/2021 [facility] Results  Procedure Component Value Ref Range Date/Time  [place] LT BREAST BX W LOC DEV 1ST LESION IMG BX SPEC US GUIDE [[id number]] Resulted: 04/02/21 1333  Order Status: Completed Updated: 04/02/21 1533  Addenda:      ADDENDUM REPORT: 04/02/2021 13:33   ADDENDUM: PATHOLOGY revealed: A. LYMPH NODE, LEFT AXILLA; ULTRASOUND-GUIDED BIOPSY: - ABSCESS WITH FOREIGN BODY GIANT CELL REACTION. - FOCAL BENIGN BREAST TISSUE. - NEGATIVE FOR ATYPIA AND MALIGNANCY. Comment: Special stains for acid fast organisms and fungal elements (AFB and GMS) are negative. Controls stain appropriately.   Pathology results are  CONCORDANT with imaging findings, per Dr. Valentino Schmidt.   Pathology results and recommendations were discussed with patient via telephone on 04/01/2020. Patient reported biopsy site doing well with no adverse symptoms, and only slight tenderness at the site. Post biopsy care instructions were reviewed, questions were answered and my direct phone number was provided. Patient was instructed to call [facility] for any additional questions or concerns related to biopsy site.   RECOMMENDATIONS: RECOMMENDATIONS 1. Followup with provider for consideration of antibiotic therapy given pathology results of Abscess.   2. If patient desires definitive removal of the dermal lesion, recommend dermatology referral from provider.   3. Patient instructed to continue monthly self breast examinations and resume annual bilateral screening mammogram due December 2023.   NOTE: I spoke with Rosita Fire RN at provider office on 04/01/2021, and she will relay recommendations to provider and notify patient with treatment plan.   Pathology results reported by Electa Sniff RN on 04/02/2021.     Electronically Signed   By: Brittney Schmidt M.D.   On: 04/02/2021 13:33   Signed: 04/02/21 1533 by Verdell Face, MD  Narrative:    CLINICAL DATA:  Possible intradermal mass   EXAM:  ULTRASOUND GUIDED LEFT BREAST CORE NEEDLE BIOPSY   COMPARISON:  Previous exam(s).   PROCEDURE:  I met with the patient and we discussed the procedure of  ultrasound-guided biopsy, including benefits and alternatives. We  discussed the high likelihood of a successful procedure. We  discussed the risks of the procedure, including infection, bleeding,  tissue injury, risk of subsequent visit to return for clip  placement, aseptic inflammation as a result of rupturing an  epidermal inclusion cyst, and inadequate sampling. Informed written  consent was given. The usual time-out protocol was performed  immediately prior to the procedure.    Lesion quadrant: Upper outer quadrant   On today's exam, the mass appears intradermal in location with a  favored tract to the skin noted. Given appearance of a benign  intradermal lesion on today's exam, discussion was held with patient  regarding option for short-term follow-up versus definitive  characterization with biopsy. Patient elected to proceed with  biopsy. Using sterile technique and 1% lidocaine and 1% lidocaine  with epinephrine as local anesthetic, under direct ultrasound  visualization, a 14 gauge spring-loaded device was used to perform  biopsy of a LEFT axillary mass using an inferolateral approach. Clip  placement was deferred due to favored intradermal location.   IMPRESSION:  1. Ultrasound guided biopsy of a favored axillary LEFT intradermal  mass. No apparent complications.   2. Discussion was held with patient  regarding potential for aseptic  inflammation as a result of rupturing a potential epidermal  inclusion cyst. She expressed her understanding.   3. Clip placement was deferred at this time due to favored  intradermal location. Patient was counseled that if there are  malignant results of breast primary, then she will have to return  for clip placement. She expressed her understanding   Electronically Signed:  By: Brittney Schmidt M.D.  On: 03/28/2021 09:17     Results for orders placed or performed during the hospital encounter of 03/28/21  Surgical pathology  Result Value Ref Range   SURGICAL PATHOLOGY      SURGICAL PATHOLOGY CASE: [id number] PATIENT: Brittney Schmidt Surgical Pathology Report     Specimen Submitted: A. Lymph node, left axilla  Clinical History: Palpable left axillary favored intradermal mass. Likely tract noted to skin on today's exam.  Epidermal inclusion cyst, sebaceous cyst, benign dermal lesion, malignancy      DIAGNOSIS: A. LYMPH NODE, LEFT AXILLA; ULTRASOUND-GUIDED BIOPSY: - ABSCESS WITH FOREIGN BODY  GIANT CELL REACTION. - FOCAL BENIGN BREAST TISSUE. - NEGATIVE FOR ATYPIA AND MALIGNANCY.  Comment: Special stains for acid fast organisms and fungal elements (AFB and GMS) are negative.  Controls stain appropriately.   GROSS DESCRIPTION: A. Labeled: Left axilla Received: Formalin Time/date in fixative: Collected and placed in formalin 8:49 AM on 03/28/2021 Cold ischemic time: Less than 1 minute Total fixation time: Approximately 8.5 hours Core pieces: 3 cores and multiple additional fragments Size: Range from 0.8-1.1 cm in length and  0.2 cm in diameter Description: Received are cores and fragments of yellow fibrofatty tissue.  The additional fragments are 1.1 x 0.2 x 0.1 cm in aggregate. Ink color: Black Entirely submitted in cassettes 1-2 with 2 cores in cassette 1 and 1 core with the remaining fragments in cassette 2.  RB 03/28/2021  Final Diagnosis performed by Betsy Pries, MD.   Electronically signed 04/01/2021 11:55:03AM The electronic signature indicates that the named Attending Pathologist has evaluated the specimen Technical component performed at [facility], [address] [place], [place] [address] Lab: [phone] Dir: Rush Farmer, MD, MMM  Professional component performed at [facility], [facility], [place], [place], [place] [address] Lab: [phone] Dir: Kathi Simpers, MD       Assessment & Plan:   Problem List Items Addressed This Visit     Obesity (BMI 30.0-34.9)   Essential hypertension    Well-controlled HTN - Home BP readings none  No known complications    Plan:  1. Continue current BP regimen - Lisinopril 20-$RemoveBeforeD'25mg'[id number]$  daily REFILL 2. Encourage improved lifestyle - low sodium diet, regular exercise 3. Continue monitor BP outside office, bring readings to next visit, if persistently >140/90 or new symptoms notify office sooner      Relevant Medications   lisinopril-hydrochlorothiazide (ZESTORETIC) 20-25 MG tablet   Other  Relevant Orders   COMPLETE METABOLIC PANEL WITH GFR   CBC with Differential/Platelet   Dyslipidemia (high LDL; low HDL)    Due for lipids Encourage lifestyle management      Relevant Orders   COMPLETE METABOLIC PANEL WITH GFR   Lipid panel   Other Visit Diagnoses     Axillary abscess    -  Primary   Relevant Medications   doxycycline (VIBRA-TABS) 100 MG tablet   Abnormal glucose       Relevant Orders   Hemoglobin A1c       Axillary Abscess, L Already had biopsy Now improved but still  has localized nodular cyst vs abscess in axillary Start taking Doxycycline antibiotic 139m twice daily for 10 days. Take with full glass of water and stay upright for at least 30 min after taking, may be seated or standing, but should NOT lay down. This is just a safety precaution, if this medicine does not go all the way down throat well it could cause some burning discomfort to throat and esophagus. F/u if recurrence  Meds ordered this encounter  Medications   doxycycline (VIBRA-TABS) 100 MG tablet    Sig: Take 1 tablet (100 mg total) by mouth 2 (two) times daily. For 10 days. Take with full glass of water, stay upright 30 min after taking.    Dispense:  20 tablet    Refill:  0   lisinopril-hydrochlorothiazide (ZESTORETIC) 20-25 MG tablet    Sig: Take 1 tablet by mouth daily.    Dispense:  90 tablet    Refill:  3     Follow up plan: Return in about 6 months (around 10/03/2021) for 6 month Follow-up HTN, Cholesterol, Allergies.   ANobie Putnam DAltusMedical Group 04/05/2021, 9:12 AM

## 2021-04-05 NOTE — Patient Instructions (Addendum)
Thank you for coming to the office today.  For abscess left armpit Start taking Doxycycline antibiotic 100mg  twice daily for 10 days. Take with full glass of water and stay upright for at least 30 min after taking, may be seated or standing, but should NOT lay down. This is just a safety precaution, if this medicine does not go all the way down throat well it could cause some burning discomfort to throat and esophagus.  If it persists we can refer to Dermatology  ----  Refill BP medication  Blood work today, will call with results.  For sinuses keep on nasal spray.  Future flu shot if interested  Please schedule a Follow-up Appointment to: Return in about 6 months (around 10/03/2021) for 6 month Follow-up HTN, Cholesterol, Allergies.  If you have any other questions or concerns, please feel free to call the office or send a message through [facility]. You may also schedule an earlier appointment if necessary.  Additionally, you may be receiving a survey about your experience at our office within a few days to 1 week by e-mail or mail. We value your feedback.  Nobie Putnam, DO [place]

## 2021-04-05 NOTE — Assessment & Plan Note (Signed)
Due for lipids Encourage lifestyle management

## 2021-04-05 NOTE — Assessment & Plan Note (Signed)
Well-controlled HTN - Home BP readings none  No known complications    Plan:  1. Continue current BP regimen - Lisinopril 20-25mg  daily REFILL 2. Encourage improved lifestyle - low sodium diet, regular exercise 3. Continue monitor BP outside office, bring readings to next visit, if persistently >140/90 or new symptoms notify office sooner

## 2021-04-06 LAB — COMPLETE METABOLIC PANEL WITH GFR
AG Ratio: 1.6 (calc) (ref 1.0–2.5)
ALT: 15 U/L (ref 6–29)
AST: 18 U/L (ref 10–35)
Albumin: 4.2 g/dL (ref 3.6–5.1)
Alkaline phosphatase (APISO): 44 U/L (ref 37–153)
BUN: 11 mg/dL (ref 7–25)
CO2: 29 mmol/L (ref 20–32)
Calcium: 9.7 mg/dL (ref 8.6–10.4)
Chloride: 105 mmol/L (ref 98–110)
Creat: 0.8 mg/dL (ref 0.50–1.05)
Globulin: 2.6 g/dL (calc) (ref 1.9–3.7)
Glucose, Bld: 91 mg/dL (ref 65–99)
Potassium: 3.4 mmol/L — ABNORMAL LOW (ref 3.5–5.3)
Sodium: 143 mmol/L (ref 135–146)
Total Bilirubin: 0.6 mg/dL (ref 0.2–1.2)
Total Protein: 6.8 g/dL (ref 6.1–8.1)
eGFR: 80 mL/min/{1.73_m2} (ref >=60)

## 2021-04-06 LAB — CBC WITH DIFFERENTIAL/PLATELET
Absolute Monocytes: 368 cells/uL (ref 200–950)
Basophils Absolute: 39 cells/uL (ref 0–200)
Basophils Relative: 0.8 %
Eosinophils Absolute: 69 cells/uL (ref 15–500)
Eosinophils Relative: 1.4 %
HCT: 43.5 % (ref 35.0–45.0)
Hemoglobin: 13.9 g/dL (ref 11.7–15.5)
Lymphs Abs: 2014 cells/uL (ref 850–3900)
MCH: 27.5 pg (ref 27.0–33.0)
MCHC: 32 g/dL (ref 32.0–36.0)
MCV: 86 fL (ref 80.0–100.0)
MPV: 11.4 fL (ref 7.5–12.5)
Monocytes Relative: 7.5 %
Neutro Abs: 2411 cells/uL (ref 1500–7800)
Neutrophils Relative %: 49.2 %
Platelets: 231 10*3/uL (ref 140–400)
RBC: 5.06 10*6/uL (ref 3.80–5.10)
RDW: 12.5 % (ref 11.0–15.0)
Total Lymphocyte: 41.1 %
WBC: 4.9 10*3/uL (ref 3.8–10.8)

## 2021-04-06 LAB — HEMOGLOBIN A1C
Hgb A1c MFr Bld: 5.4 % of total Hgb (ref <5.7)
Mean Plasma Glucose: 108 mg/dL
eAG (mmol/L): 6 mmol/L

## 2021-04-06 LAB — LIPID PANEL
Cholesterol: 215 mg/dL — ABNORMAL HIGH (ref <200)
HDL: 53 mg/dL (ref >=50)
LDL Cholesterol (Calc): 140 mg/dL (calc) — ABNORMAL HIGH
Non-HDL Cholesterol (Calc): 162 mg/dL (calc) — ABNORMAL HIGH (ref <130)
Total CHOL/HDL Ratio: 4.1 (calc) (ref <5.0)
Triglycerides: 108 mg/dL (ref <150)

## 2021-06-03 DIAGNOSIS — M2011 Hallux valgus (acquired), right foot: Secondary | ICD-10-CM | POA: Diagnosis not present

## 2021-06-03 DIAGNOSIS — M778 Other enthesopathies, not elsewhere classified: Secondary | ICD-10-CM | POA: Diagnosis not present

## 2021-06-03 DIAGNOSIS — M898X9 Other specified disorders of bone, unspecified site: Secondary | ICD-10-CM | POA: Diagnosis not present

## 2021-06-03 DIAGNOSIS — M2042 Other hammer toe(s) (acquired), left foot: Secondary | ICD-10-CM | POA: Diagnosis not present

## 2021-06-03 DIAGNOSIS — M2012 Hallux valgus (acquired), left foot: Secondary | ICD-10-CM | POA: Diagnosis not present

## 2021-06-21 ENCOUNTER — Ambulatory Visit: Payer: Medicare Other

## 2021-06-21 LAB — HM HEPATITIS C SCREENING LAB: HM Hepatitis Screen: NEGATIVE

## 2021-06-25 ENCOUNTER — Ambulatory Visit: Payer: Medicare Other

## 2021-06-28 ENCOUNTER — Ambulatory Visit: Payer: Medicare Other

## 2021-07-05 ENCOUNTER — Encounter: Payer: Self-pay | Admitting: Family Medicine

## 2021-07-05 ENCOUNTER — Ambulatory Visit (INDEPENDENT_AMBULATORY_CARE_PROVIDER_SITE_OTHER): Payer: Medicare Other

## 2021-07-05 VITALS — BP 124/68 | HR 69 | Temp 98.0°F | Ht 60.0 in | Wt 173.0 lb

## 2021-07-05 DIAGNOSIS — Z0000 Encounter for general adult medical examination without abnormal findings: Secondary | ICD-10-CM

## 2021-07-05 NOTE — Patient Instructions (Signed)
Brittney Schmidt , ?Thank you for taking time to come for your Medicare Wellness Visit. I appreciate your ongoing commitment to your health goals. Please review the following plan we discussed and let me know if I can assist you in the future.  ? ?Screening recommendations/referrals: ?Colonoscopy: 01/18/19 ?Mammogram: 02/22/21 ?Bone Density: n/d ?Recommended yearly ophthalmology/optometry visit for glaucoma screening and checkup ?Recommended yearly dental visit for hygiene and checkup ? ?Vaccinations: ?Influenza vaccine: 01/05/20 ?Pneumococcal vaccine: 11/16/18 ?Tdap vaccine: 12/23/15 ?Shingles vaccine: n/d   ?Covid-19:06/16/19, 07/12/19 ? ?Advanced directives: no ? ?Conditions/risks identified: none ? ?Next appointment: Follow up in one year for your annual wellness visit 07/11/22 @ 10am in person ? ? ?Preventive Care 80 Years and Older, Female ?Preventive care refers to lifestyle choices and visits with your health care provider that can promote health and wellness. ?What does preventive care include? ?A yearly physical exam. This is also called an annual well check. ?Dental exams once or twice a year. ?Routine eye exams. Ask your health care provider how often you should have your eyes checked. ?Personal lifestyle choices, including: ?Daily care of your teeth and gums. ?Regular physical activity. ?Eating a healthy diet. ?Avoiding tobacco and drug use. ?Limiting alcohol use. ?Practicing safe sex. ?Taking low-dose aspirin every day. ?Taking vitamin and mineral supplements as recommended by your health care provider. ?What happens during an annual well check? ?The services and screenings done by your health care provider during your annual well check will depend on your age, overall health, lifestyle risk factors, and family history of disease. ?Counseling  ?Your health care provider may ask you questions about your: ?Alcohol use. ?Tobacco use. ?Drug use. ?Emotional well-being. ?Home and relationship well-being. ?Sexual  activity. ?Eating habits. ?History of falls. ?Memory and ability to understand (cognition). ?Work and work Statistician. ?Reproductive health. ?Screening  ?You may have the following tests or measurements: ?Height, weight, and BMI. ?Blood pressure. ?Lipid and cholesterol levels. These may be checked every 5 years, or more frequently if you are over 65 years old. ?Skin check. ?Lung cancer screening. You may have this screening every year starting at age 81 if you have a 30-pack-year history of smoking and currently smoke or have quit within the past 15 years. ?Fecal occult blood test (FOBT) of the stool. You may have this test every year starting at age 50. ?Flexible sigmoidoscopy or colonoscopy. You may have a sigmoidoscopy every 5 years or a colonoscopy every 10 years starting at age 68. ?Hepatitis C blood test. ?Hepatitis B blood test. ?Sexually transmitted disease (STD) testing. ?Diabetes screening. This is done by checking your blood sugar (glucose) after you have not eaten for a while (fasting). You may have this done every 1-3 years. ?Bone density scan. This is done to screen for osteoporosis. You may have this done starting at age 72. ?Mammogram. This may be done every 1-2 years. Talk to your health care provider about how often you should have regular mammograms. ?Talk with your health care provider about your test results, treatment options, and if necessary, the need for more tests. ?Vaccines  ?Your health care provider may recommend certain vaccines, such as: ?Influenza vaccine. This is recommended every year. ?Tetanus, diphtheria, and acellular pertussis (Tdap, Td) vaccine. You may need a Td booster every 10 years. ?Zoster vaccine. You may need this after age 12. ?Pneumococcal 13-valent conjugate (PCV13) vaccine. One dose is recommended after age 58. ?Pneumococcal polysaccharide (PPSV23) vaccine. One dose is recommended after age 45. ?Talk to your health care provider about  which screenings and vaccines  you need and how often you need them. ?This information is not intended to replace advice given to you by your health care provider. Make sure you discuss any questions you have with your health care provider. ?Document Released: 04/06/2015 Document Revised: 11/28/2015 Document Reviewed: 01/09/2015 ?Elsevier Interactive Patient Education ? 2017 [employer]. ? ?Fall Prevention in the Home ?Falls can cause injuries. They can happen to people of all ages. There are many things you can do to make your home safe and to help prevent falls. ?What can I do on the outside of my home? ?Regularly fix the edges of walkways and driveways and fix any cracks. ?Remove anything that might make you trip as you walk through a door, such as a raised step or threshold. ?Trim any bushes or trees on the path to your home. ?Use bright outdoor lighting. ?Clear any walking paths of anything that might make someone trip, such as rocks or tools. ?Regularly check to see if handrails are loose or broken. Make sure that both sides of any steps have handrails. ?Any raised decks and porches should have guardrails on the edges. ?Have any leaves, snow, or ice cleared regularly. ?Use sand or salt on walking paths during winter. ?Clean up any spills in your garage right away. This includes oil or grease spills. ?What can I do in the bathroom? ?Use night lights. ?Install grab bars by the toilet and in the tub and shower. Do not use towel bars as grab bars. ?Use non-skid mats or decals in the tub or shower. ?If you need to sit down in the shower, use a plastic, non-slip stool. ?Keep the floor dry. Clean up any water that spills on the floor as soon as it happens. ?Remove soap buildup in the tub or shower regularly. ?Attach bath mats securely with double-sided non-slip rug tape. ?Do not have throw rugs and other things on the floor that can make you trip. ?What can I do in the bedroom? ?Use night lights. ?Make sure that you have a light by your bed that  is easy to reach. ?Do not use any sheets or blankets that are too big for your bed. They should not hang down onto the floor. ?Have a firm chair that has side arms. You can use this for support while you get dressed. ?Do not have throw rugs and other things on the floor that can make you trip. ?What can I do in the kitchen? ?Clean up any spills right away. ?Avoid walking on wet floors. ?Keep items that you use a lot in easy-to-reach places. ?If you need to reach something above you, use a strong step stool that has a grab bar. ?Keep electrical cords out of the way. ?Do not use floor polish or wax that makes floors slippery. If you must use wax, use non-skid floor wax. ?Do not have throw rugs and other things on the floor that can make you trip. ?What can I do with my stairs? ?Do not leave any items on the stairs. ?Make sure that there are handrails on both sides of the stairs and use them. Fix handrails that are broken or loose. Make sure that handrails are as long as the stairways. ?Check any carpeting to make sure that it is firmly attached to the stairs. Fix any carpet that is loose or worn. ?Avoid having throw rugs at the top or bottom of the stairs. If you do have throw rugs, attach them to the floor with  carpet tape. ?Make sure that you have a light switch at the top of the stairs and the bottom of the stairs. If you do not have them, ask someone to add them for you. ?What else can I do to help prevent falls? ?Wear shoes that: ?Do not have high heels. ?Have rubber bottoms. ?Are comfortable and fit you well. ?Are closed at the toe. Do not wear sandals. ?If you use a stepladder: ?Make sure that it is fully opened. Do not climb a closed stepladder. ?Make sure that both sides of the stepladder are locked into place. ?Ask someone to hold it for you, if possible. ?Clearly mark and make sure that you can see: ?Any grab bars or handrails. ?First and last steps. ?Where the edge of each step is. ?Use tools that help you  move around (mobility aids) if they are needed. These include: ?Canes. ?Walkers. ?Scooters. ?Crutches. ?Turn on the lights when you go into a dark area. Replace any light bulbs as soon as they burn out. ?Se

## 2021-07-05 NOTE — Progress Notes (Signed)
? ?Subjective:  ? Brittney Schmidt is a 71 y.o. female who presents for Medicare Annual (Subsequent) preventive examination. ? ?Review of Systems    ? ?  ? ?   ?Objective:  ?  ?Today's Vitals  ? 07/05/21 1029  ?BP: 124/68  ?Pulse: 69  ?Temp: 98 ?F (36.7 ?C)  ?TempSrc: Skin  ?SpO2: 98%  ?Weight: 173 lb (78.5 kg)  ?Height: 5' (1.524 m)  ? ?Body mass index is 33.79 kg/m?. ? ? ?  06/19/2020  ? 11:43 AM 01/18/2019  ?  9:42 AM 11/16/2018  ? 10:26 AM 04/19/2018  ? 11:24 AM 10/28/2017  ?  3:25 PM 04/03/2017  ?  9:22 AM  ?Advanced Directives  ?Does Patient Have a Medical Advance Directive? No No No No Yes No  ?Would patient like information on creating a medical advance directive?  No - Patient declined    No - Patient declined  ? ? ?Current Medications (verified) ?Outpatient Encounter Medications as of 07/05/2021  ?Medication Sig  ? aspirin 81 MG tablet Take 81 mg by mouth daily.  ? doxycycline (VIBRA-TABS) 100 MG tablet Take 1 tablet (100 mg total) by mouth 2 (two) times daily. For 10 days. Take with full glass of water, stay upright 30 min after taking.  ? fluticasone (FLONASE) 50 MCG/ACT nasal spray Place 2 sprays into both nostrils daily. Use for 4-6 weeks then stop and use seasonally or as needed.  ? lisinopril-hydrochlorothiazide (ZESTORETIC) 20-25 MG tablet Take 1 tablet by mouth daily.  ? SUMAtriptan (IMITREX) 50 MG tablet Take 1 tablet (50 mg total) by mouth once as needed for up to 1 dose for migraine. May repeat one dose in 2 hours if headache persists, for max dose 24 hours  ? ?No facility-administered encounter medications on file as of 07/05/2021.  ? ? ?Allergies (verified) ?Patient has no known allergies.  ? ?History: ?Past Medical History:  ?Diagnosis Date  ? Ankle swelling   ? Hypertension   ? Wears dentures   ? full upper, partial lower (doesn't wear lower)  ? ?Past Surgical History:  ?Procedure Laterality Date  ? ABDOMINAL HYSTERECTOMY    ? COLONOSCOPY WITH PROPOFOL N/A 01/18/2019  ? Procedure: COLONOSCOPY WITH  PROPOFOL;  Surgeon: Virgel Manifold, MD;  Location: [facility];  Service: Endoscopy;  Laterality: N/A;  ? POLYPECTOMY  01/18/2019  ? Procedure: POLYPECTOMY;  Surgeon: Virgel Manifold, MD;  Location: [place];  Service: Endoscopy;;  ? TUBAL LIGATION    ? ?Family History  ?Problem Relation Age of Onset  ? Diabetes Mother   ? Hypertension Mother   ? Colon polyps Father   ? Heart disease Father   ? Heart attack Father   ? Cancer Brother   ? Hypertension Son   ? Breast cancer Neg Hx   ? Colon cancer Neg Hx   ? ?Social History  ? ?Socioeconomic History  ? Marital status: Single  ?  Spouse name: Not on file  ? Number of children: 3  ? Years of education: GED  ? Highest education level: Not on file  ?Occupational History  ? Occupation: unemployed (previously worked as temp)  ?Tobacco Use  ? Smoking status: Never  ? Smokeless tobacco: Never  ?Vaping Use  ? Vaping Use: Never used  ?Substance and Sexual Activity  ? Alcohol use: Yes  ?  Alcohol/week: 2.0 standard drinks  ?  Types: 2 Cans of beer per week  ?  Comment: occasional, planning to quit completely  ?  Drug use: No  ? Sexual activity: Not Currently  ?  Partners: Male  ?Other Topics Concern  ? Not on file  ?Social History Narrative  ? Has family that lives close by  ? ?Social Determinants of Health  ? ?Financial Resource Strain: Not on file  ?Food Insecurity: Not on file  ?Transportation Needs: Not on file  ?Physical Activity: Not on file  ?Stress: Not on file  ?Social Connections: Not on file  ? ? ?Tobacco Counseling ?Counseling given: Not Answered ? ? ?Clinical Intake: ? ?Pre-visit preparation completed: Yes ? ?Pain : No/denies pain ? ?  ? ?Nutritional Risks: None ?Diabetes: No ? ?How often do you need to have someone help you when you read instructions, pamphlets, or other written materials from your doctor or pharmacy?: 1 - Never ? ?Diabetic?no ? ?Interpreter Needed?: No ? ?Information entered by :: Kirke Shaggy, LPN ? ? ?Activities of  Daily Living ?   ? View : No data to display.  ?  ?  ?  ? ? ?Patient Care Team: ?Olin Hauser, DO as PCP - General (Family Medicine) ?Rico Junker, RN as Registered Nurse ?Theodore Demark, RN as Registered Nurse ? ?Indicate any recent Medical Services you may have received from other than [facility] providers in the past year (date may be approximate). ? ?   ?Assessment:  ? This is a routine wellness examination for Brittney Schmidt. ? ?Hearing/Vision screen ?No results found. ? ?Dietary issues and exercise activities discussed: ?  ? ? Goals Addressed   ?None ?  ? ?Depression Screen ? ?  06/19/2020  ? 11:45 AM 11/02/2019  ? 11:42 AM 07/15/2019  ?  1:39 PM 05/09/2019  ? 10:25 AM 12/07/2018  ?  9:52 AM 11/16/2018  ? 10:26 AM 05/31/2018  ?  9:34 AM  ?PHQ 2/9 Scores  ?PHQ - 2 Score 0 0 0 0 1 0 0  ?  ?Fall Risk ? ?  06/19/2020  ? 11:45 AM 11/02/2019  ? 11:42 AM 07/15/2019  ?  1:39 PM 05/09/2019  ? 10:25 AM 11/16/2018  ? 10:26 AM  ?Fall Risk   ?Falls in the past year? 0 0 0 0 0  ?Number falls in past yr:  0 0 0   ?Injury with Fall?  0 0 0   ?Risk for fall due to : Medication side effect      ?Follow up Falls evaluation completed;Education provided;Falls prevention discussed Falls evaluation completed Falls evaluation completed Falls evaluation completed   ? ? ?FALL RISK PREVENTION PERTAINING TO THE HOME: ? ?Any stairs in or around the home? No  ?If so, are there any without handrails? No  ?Home free of loose throw rugs in walkways, pet beds, electrical cords, etc? Yes  ?Adequate lighting in your home to reduce risk of falls? Yes  ? ?ASSISTIVE DEVICES UTILIZED TO PREVENT FALLS: ? ?Life alert? No  ?Use of a cane, walker or w/c? No  ?Grab bars in the bathroom? No  ?Shower chair or bench in shower? No  ?Elevated toilet seat or a handicapped toilet? No  ? ?TIMED UP AND GO: ? ?Was the test performed? Yes .  ?Length of time to ambulate 10 feet: 4 sec.  ? ?Gait steady and fast without use of assistive device ? ?Cognitive Function: ? ?   10/28/2017  ?  3:37 PM  ?MMSE - Mini Mental State Exam  ?Orientation to time 5  ?Orientation to Place 5  ?Registration 3  ?Attention/ Calculation 5  ?  Recall 1  ?Language- name 2 objects 2  ?Language- repeat 1  ?Language- follow 3 step command 3  ?Language- read & follow direction 1  ?Write a sentence 1  ?Copy design 1  ?Total score 28  ? ?  ? ?  06/19/2020  ? 11:48 AM 11/16/2018  ? 10:31 AM  ?6CIT Screen  ?What Year? 0 points 0 points  ?What month? 0 points 0 points  ?What time? 0 points 0 points  ?Count back from 20 0 points 0 points  ?Months in reverse 2 points 0 points  ?Repeat phrase 6 points 0 points  ?Total Score 8 points 0 points  ? ? ?Immunizations ?Immunization History  ?Administered Date(s) Administered  ? Fluad Quad(high Dose 65+) 12/07/2018, 01/05/2020  ? Influenza-Unspecified 04/14/2018  ? PFIZER(Purple Top)SARS-COV-2 Vaccination 06/16/2019, 07/12/2019  ? Pneumococcal Conjugate-13 10/28/2017  ? Pneumococcal Polysaccharide-23 11/16/2018  ? Tdap 10/17/2015  ? ? ?TDAP status: Up to date ? ?Flu Vaccine status: Declined, Education has been provided regarding the importance of this vaccine but patient still declined. Advised may receive this vaccine at local pharmacy or Health Dept. Aware to provide a copy of the vaccination record if obtained from local pharmacy or Health Dept. Verbalized acceptance and understanding. ? ?Pneumococcal vaccine status: Up to date ? ?Covid-19 vaccine status: Completed vaccines ? ?Qualifies for Shingles Vaccine? Yes   ?Zostavax completed No   ?Shingrix Completed?: No.    Education has been provided regarding the importance of this vaccine. Patient has been advised to call insurance company to determine out of pocket expense if they have not yet received this vaccine. Advised may also receive vaccine at local pharmacy or Health Dept. Verbalized acceptance and understanding. ? ?Screening Tests ?Health Maintenance  ?Topic Date Due  ? Hepatitis C Screening  Never done  ? Zoster Vaccines-  Shingrix (1 of 2) Never done  ? DEXA SCAN  Never done  ? COVID-19 Vaccine (3 - Pfizer risk series) 08/09/2019  ? INFLUENZA VACCINE  10/22/2021  ? MAMMOGRAM  02/23/2023  ? COLONOSCOPY (Pts 45-34yr Insurance c

## 2021-08-20 ENCOUNTER — Emergency Department: Payer: Medicare Other

## 2021-08-20 ENCOUNTER — Emergency Department
Admission: EM | Admit: 2021-08-20 | Discharge: 2021-08-20 | Disposition: A | Discharge status: Home / Self Care | Payer: Medicare Other | Attending: Emergency Medicine | Admitting: Emergency Medicine

## 2021-08-20 ENCOUNTER — Other Ambulatory Visit: Payer: Self-pay

## 2021-08-20 DIAGNOSIS — I10 Essential (primary) hypertension: Secondary | ICD-10-CM | POA: Diagnosis not present

## 2021-08-20 DIAGNOSIS — R202 Paresthesia of skin: Secondary | ICD-10-CM | POA: Diagnosis not present

## 2021-08-20 DIAGNOSIS — R0789 Other chest pain: Secondary | ICD-10-CM | POA: Diagnosis not present

## 2021-08-20 DIAGNOSIS — R079 Chest pain, unspecified: Secondary | ICD-10-CM

## 2021-08-20 LAB — BASIC METABOLIC PANEL
Anion gap: 10 (ref 5–15)
BUN: 14 mg/dL (ref 8–23)
CO2: 29 mmol/L (ref 22–32)
Calcium: 9.5 mg/dL (ref 8.9–10.3)
Chloride: 102 mmol/L (ref 98–111)
Creatinine, Ser: 0.78 mg/dL (ref 0.44–1.00)
GFR, Estimated: >60 mL/min (ref >60)
Glucose, Bld: 86 mg/dL (ref 70–99)
Potassium: 3.3 mmol/L — ABNORMAL LOW (ref 3.5–5.1)
Sodium: 141 mmol/L (ref 135–145)

## 2021-08-20 LAB — CBC
HCT: 43.4 % (ref 36.0–46.0)
Hemoglobin: 13.6 g/dL (ref 12.0–15.0)
MCH: 27.1 pg (ref 26.0–34.0)
MCHC: 31.3 g/dL (ref 30.0–36.0)
MCV: 86.6 fL (ref 80.0–100.0)
Platelets: 215 10*3/uL (ref 150–400)
RBC: 5.01 MIL/uL (ref 3.87–5.11)
RDW: 13.2 % (ref 11.5–15.5)
WBC: 5.9 10*3/uL (ref 4.0–10.5)
nRBC: 0 % (ref 0.0–0.2)

## 2021-08-20 LAB — TROPONIN I (HIGH SENSITIVITY): Troponin I (High Sensitivity): 4 ng/L (ref <18)

## 2021-08-20 NOTE — ED Notes (Signed)
Went to discharge patient but patient was not in the room.

## 2021-08-20 NOTE — ED Provider Notes (Signed)
[facility] Provider Note    Event Date/Time   First MD Initiated Contact with Patient 08/20/21 1338     (approximate)  History   Chief Complaint: Chest Pain  HPI  Brittney Schmidt is a 71 y.o. female with a past medical history of hypertension presents to the emergency department for left chest pain.  According to the patient she woke this morning with left-sided chest pain also states a tingling sensation in her left hand like she had slept on the arm.  Patient denies any shortness of breath nausea or diaphoresis.  States the tingling sensation went away very quickly continued to have chest pain however since arriving to the emergency department the chest pain is now gone as well.  Patient denies any discomfort.  Denies any pleuritic pain at any point.  Denies any cough or fever no leg pain or swelling.  Physical Exam   Triage Vital Signs: ED Triage Vitals  Enc Vitals Group     BP 08/20/21 1207 (!) 145/74     Pulse Rate 08/20/21 1207 80     Resp 08/20/21 1207 19     Temp 08/20/21 1207 98.5 F (36.9 C)     Temp src --      SpO2 08/20/21 1207 96 %     Weight --      Height --      Head Circumference --      Peak Flow --      Pain Score 08/20/21 1202 10     Pain Loc --      Pain Edu? --      Excl. in [place]? --     Most recent vital signs: Vitals:   08/20/21 1207  BP: (!) 145/74  Pulse: 80  Resp: 19  Temp: 98.5 F (36.9 C)  SpO2: 96%    General: Awake, no distress.  CV:  Good peripheral perfusion.  Regular rate and rhythm  Resp:  Normal effort.  Equal breath sounds bilaterally.  Chest wall is nontender to palpation. Abd:  No distention.  Soft, nontender.  No rebound or guarding.    ED Results / Procedures / Treatments   EKG  EKG viewed and interpreted by myself shows normal sinus rhythm at 76 bpm with a narrow QRS, left axis deviation, largely normal intervals with no concerning ST changes.  RADIOLOGY  I have reviewed the chest x-ray  images no significant abnormality seen on my interpretation. Radiology is read the chest x-ray is negative   MEDICATIONS ORDERED IN ED: Medications - No data to display   IMPRESSION / MDM / [place] / ED COURSE  I reviewed the triage vital signs and the nursing notes.  Patient's presentation is most consistent with acute complicated illness / injury requiring diagnostic workup.  Patient presents to the emergency department for left chest discomfort since awakening early this morning per patient.  Patient states it felt like she had slept on her left arm wrong but was concerned because the chest pain continued so she came to the emergency department.  However since arriving to the emergency department the chest pain is now resolved.  Currently patient appears well, chest is nontender to palpation.  Lab work is reassuring including negative troponin.  Normal CBC and a normal chemistry.  Chest x-ray is negative.  I discussed with the patient possibility of chest wall discomfort however also discussed given her age I would like her to follow-up with cardiology for further  evaluation such as a stress test.  Given the patient's age I did consider admission however given her reassuring work-up I believe she would be safe for discharge with follow-up.  Patient agreeable to plan.    FINAL CLINICAL IMPRESSION(S) / ED DIAGNOSES   Nonspecific chest pain   Note:  This document was prepared using Dragon voice recognition software and may include unintentional dictation errors.   Harvest Dark, MD 08/20/21 1404

## 2021-08-20 NOTE — Discharge Instructions (Signed)
Please call the number provided for cardiology to arrange a follow-up appointment.

## 2021-08-20 NOTE — ED Triage Notes (Signed)
Pt comes with c/o left sided CP that started this am when she woke up. Pt denies any SOB., N/V/D.  Pt denies any radiation to neck, jaw or arm.   Pt states 10/10 pain.

## 2021-08-20 NOTE — ED Notes (Signed)
Pt was in the bathroom. See dispo for discharge of patient.

## 2021-09-26 DIAGNOSIS — H25813 Combined forms of age-related cataract, bilateral: Secondary | ICD-10-CM | POA: Diagnosis not present

## 2021-10-03 ENCOUNTER — Ambulatory Visit: Payer: Medicare Other | Admitting: Family Medicine

## 2021-10-07 ENCOUNTER — Ambulatory Visit (INDEPENDENT_AMBULATORY_CARE_PROVIDER_SITE_OTHER): Payer: Medicare Other | Admitting: Family Medicine

## 2021-10-07 ENCOUNTER — Encounter: Payer: Self-pay | Admitting: Family Medicine

## 2021-10-07 VITALS — BP 130/61 | HR 77 | Ht 60.0 in | Wt 175.0 lb

## 2021-10-07 DIAGNOSIS — E785 Hyperlipidemia, unspecified: Secondary | ICD-10-CM | POA: Diagnosis not present

## 2021-10-07 DIAGNOSIS — I10 Essential (primary) hypertension: Secondary | ICD-10-CM | POA: Diagnosis not present

## 2021-10-07 DIAGNOSIS — G43009 Migraine without aura, not intractable, without status migrainosus: Secondary | ICD-10-CM | POA: Insufficient documentation

## 2021-10-07 DIAGNOSIS — Z780 Asymptomatic menopausal state: Secondary | ICD-10-CM | POA: Diagnosis not present

## 2021-10-07 DIAGNOSIS — E669 Obesity, unspecified: Secondary | ICD-10-CM

## 2021-10-07 NOTE — Progress Notes (Signed)
Subjective:    Patient ID: Brittney Schmidt, female    DOB: Nov 29, 1950, 71 y.o.   MRN: [id number]  Brittney Schmidt is a 71 y.o. female presenting on 10/07/2021 for Hypertension and Hyperlipidemia   HPI   CHRONIC HTN / Obesity BMI >34 Reports no new concern. She is checking BP occasionally, doing well Current Meds - Lisinopril-HCTZ 20-'25mg'$  daily   Reports good compliance, took meds today. Tolerating well, w/o complaints. Lifestyle: She remains active Weight gain gradual in 1 year - Diet: balanced, not particular diet, limits salt - Exercise: trying now to do more regular walking for exercise Denies CP, dyspnea, HA, edema, dizziness / lightheadedness   DYSLIPIDEMIA: - Reports no concerns. Last lipid panel 2022, showed mild elevated LDL and low HDL Not on statin therapy She is not interested in statin at this time Lifestyle See above  Chest Pain ED Visit 07/2021  Migraine Headaches Infrequently occurs, takes Sumatriptan Imitrex PRN, still has med.  Callus on Foot Painful worse at night pressing on nerve   Health Maintenance: Last Mammo 02/2021 Colonoscopy 12/2018 DEXA Due     10/07/2021    9:52 AM 07/05/2021   10:35 AM 06/19/2020   11:45 AM  Depression screen PHQ 2/9  Decreased Interest 0 0 0  Down, Depressed, Hopeless 0 0 0  PHQ - 2 Score 0 0 0  Altered sleeping 0    Tired, decreased energy 0    Change in appetite 0    Feeling bad or failure about yourself  0    Trouble concentrating 0    Moving slowly or fidgety/restless 0    Suicidal thoughts 0    PHQ-9 Score 0    Difficult doing work/chores Not difficult at all      Social History   Tobacco Use   Smoking status: Never   Smokeless tobacco: Never  Vaping Use   Vaping Use: Never used  Substance Use Topics   Alcohol use: Yes    Alcohol/week: 2.0 standard drinks of alcohol    Types: 2 Cans of beer per week    Comment: occasional, planning to quit completely   Drug use: No    Review of Systems Per  HPI unless specifically indicated above     Objective:    BP 130/61   Pulse 77   Ht 5' (1.524 m)   Wt 175 lb (79.4 kg)   SpO2 100%   BMI 34.18 kg/m   Wt Readings from Last 3 Encounters:  10/07/21 175 lb (79.4 kg)  07/05/21 173 lb (78.5 kg)  04/05/21 169 lb 3.2 oz (76.7 kg)    Physical Exam Vitals and nursing note reviewed.  Constitutional:      General: She is not in acute distress.    Appearance: She is well-developed. She is not diaphoretic.     Comments: Well-appearing, comfortable, cooperative  HENT:     Head: Normocephalic and atraumatic.  Eyes:     General:        Right eye: No discharge.        Left eye: No discharge.     Conjunctiva/sclera: Conjunctivae normal.  Neck:     Thyroid: No thyromegaly.  Cardiovascular:     Rate and Rhythm: Normal rate and regular rhythm.     Heart sounds: Normal heart sounds. No murmur heard. Pulmonary:     Effort: Pulmonary effort is normal. No respiratory distress.     Breath sounds: Normal breath sounds. No wheezing or rales.  Musculoskeletal:        General: Normal range of motion.     Cervical back: Normal range of motion and neck supple.     Right lower leg: No edema.     Left lower leg: No edema.  Lymphadenopathy:     Cervical: No cervical adenopathy.  Skin:    General: Skin is warm and dry.     Findings: No erythema or rash.  Neurological:     Mental Status: She is alert and oriented to person, place, and time.  Psychiatric:        Behavior: Behavior normal.     Comments: Well groomed, good eye contact, normal speech and thoughts     I have personally reviewed the radiology report from 08/20/21 ED CXR  Study Result CLINICAL DATA: Chest pain and pressure when she woke up, history hypertension  EXAM: CHEST - 2 VIEW  COMPARISON: 09/15/2017  FINDINGS: Normal heart size, mediastinal contours, and pulmonary vascularity.  Lungs clear.  No pleural effusion or pneumothorax.  Scattered endplate spur formation  thoracic spine.  IMPRESSION: No acute abnormalities.   Electronically Signed By: Lavonia Dana M.D. On: 08/20/2021 12:51.   Results for orders placed or performed during the hospital encounter of 84/66/59  Basic metabolic panel  Result Value Ref Range   Sodium 141 135 - 145 mmol/L   Potassium 3.3 (L) 3.5 - 5.1 mmol/L   Chloride 102 98 - 111 mmol/L   CO2 29 22 - 32 mmol/L   Glucose, Bld 86 70 - 99 mg/dL   BUN 14 8 - 23 mg/dL   Creatinine, Ser 0.78 0.44 - 1.00 mg/dL   Calcium 9.5 8.9 - 10.3 mg/dL   GFR, Estimated >60 >60 mL/min   Anion gap 10 5 - 15  CBC  Result Value Ref Range   WBC 5.9 4.0 - 10.5 K/uL   RBC 5.01 3.87 - 5.11 MIL/uL   Hemoglobin 13.6 12.0 - 15.0 g/dL   HCT 43.4 36.0 - 46.0 %   MCV 86.6 80.0 - 100.0 fL   MCH 27.1 26.0 - 34.0 pg   MCHC 31.3 30.0 - 36.0 g/dL   RDW 13.2 11.5 - 15.5 %   Platelets 215 150 - 400 K/uL   nRBC 0.0 0.0 - 0.2 %  Troponin I (High Sensitivity)  Result Value Ref Range   Troponin I (High Sensitivity) 4 <18 ng/L      Assessment & Plan:   Problem List Items Addressed This Visit     Obesity (BMI 30.0-34.9)    Goal to improve exercise regimen Reduce portions Weight has slightly gained in >6 months      Migraine without aura and without status migrainosus, not intractable    Controlled PRN Migraine headache Has Sumatriptan Imitrex PRN      Essential hypertension - Primary    Well-controlled HTN - Home BP readings none  No known complications    Plan:  1. Continue current BP regimen - Lisinopril 20-'25mg'$  daily 2. Encourage improved lifestyle - low sodium diet, regular exercise 3. Continue monitor BP outside office, bring readings to next visit, if persistently >140/90 or new symptoms notify office sooner      Dyslipidemia (high LDL; low HDL)    Mild elevated Not interested in statin Next lipid due 6 months      Other Visit Diagnoses     Postmenopausal estrogen deficiency       Relevant Orders   DG Bone Density  ED Visit recently 5/30 for CP She is going to call to schedule ED Follow up for stress test outpatient Cardiology  Orders Placed This Encounter  Procedures   DG Bone Density    Standing Status:   Future    Standing Expiration Date:   10/08/2022    Order Specific Question:   Reason for Exam (SYMPTOM  OR DIAGNOSIS REQUIRED)    Answer:   postmenopausal estrogen deficiency screening DEXA    Order Specific Question:   Preferred imaging location?    Answer:   [place] [place]     No orders of the defined types were placed in this encounter.    Follow up plan: Return in about 6 months (around 04/09/2022) for 6 month Annual Physical AM apt fasting lab AFTER.   Nobie Putnam, [facility] 10/07/2021, 10:06 AM

## 2021-10-07 NOTE — Assessment & Plan Note (Signed)
Controlled PRN Migraine headache Has Sumatriptan Imitrex PRN

## 2021-10-07 NOTE — Assessment & Plan Note (Signed)
Well-controlled HTN - Home BP readings none  No known complications    Plan:  1. Continue current BP regimen - Lisinopril 20-'25mg'$  daily 2. Encourage improved lifestyle - low sodium diet, regular exercise 3. Continue monitor BP outside office, bring readings to next visit, if persistently >140/90 or new symptoms notify office sooner

## 2021-10-07 NOTE — Patient Instructions (Addendum)
Thank you for coming to the office today.  Shingles Vaccine Shingrix 2 doses at pharmacy or here in future, can make you feel like flu-like symptoms  Call the Cardiology to schedule stress test  For DEXA Scan (Bone mineral density) screening for osteoporosis  Call the [facility] below anytime to schedule your own appointment now that order has been placed.  [facility], [place] at [facility] [address] [place], [place] [address] Phone: [phone]  Next time need blood AFTER your visit.  DUE for FASTING BLOOD WORK (no food or drink after midnight before the lab appointment, only water or coffee without cream/sugar on the morning of)   Please schedule a Follow-up Appointment to: Return in about 6 months (around 04/09/2022) for 6 month Annual Physical AM apt fasting lab AFTER.  If you have any other questions or concerns, please feel free to call the office or send a message through [place]. You may also schedule an earlier appointment if necessary.  Additionally, you may be receiving a survey about your experience at our office within a few days to 1 week by e-mail or mail. We value your feedback.  Nobie Putnam, DO [place]

## 2021-10-07 NOTE — Assessment & Plan Note (Signed)
Mild elevated Not interested in statin Next lipid due 6 months

## 2021-10-07 NOTE — Assessment & Plan Note (Signed)
Goal to improve exercise regimen Reduce portions Weight has slightly gained in >6 months

## 2021-11-14 DIAGNOSIS — Q6689 Other  specified congenital deformities of feet: Secondary | ICD-10-CM | POA: Diagnosis not present

## 2021-11-14 DIAGNOSIS — M2011 Hallux valgus (acquired), right foot: Secondary | ICD-10-CM | POA: Diagnosis not present

## 2021-11-14 DIAGNOSIS — M898X9 Other specified disorders of bone, unspecified site: Secondary | ICD-10-CM | POA: Diagnosis not present

## 2021-11-14 DIAGNOSIS — M778 Other enthesopathies, not elsewhere classified: Secondary | ICD-10-CM | POA: Diagnosis not present

## 2021-11-14 DIAGNOSIS — M2012 Hallux valgus (acquired), left foot: Secondary | ICD-10-CM | POA: Diagnosis not present

## 2021-11-14 DIAGNOSIS — M2042 Other hammer toe(s) (acquired), left foot: Secondary | ICD-10-CM | POA: Diagnosis not present

## 2022-01-23 ENCOUNTER — Other Ambulatory Visit: Payer: Self-pay | Admitting: Family Medicine

## 2022-01-23 DIAGNOSIS — Z1231 Encounter for screening mammogram for malignant neoplasm of breast: Secondary | ICD-10-CM

## 2022-02-20 IMAGING — US US BREAST BX W LOC DEV 1ST LESION IMG BX SPEC US GUIDE*L*
1 series · 9 of 9 positions shown · non-contrast
Comparison: Previous exam(s).
COMPARISON: Previous exam(s).

Addendum:
CLINICAL DATA: Possible intradermal mass

EXAM:
ULTRASOUND GUIDED LEFT BREAST CORE NEEDLE BIOPSY

[Series 1: us breast bx w loc dev 1st lesion img bx spec us g · 0.04mm/px · 9 of 9 slices shown]
[im 1/9]
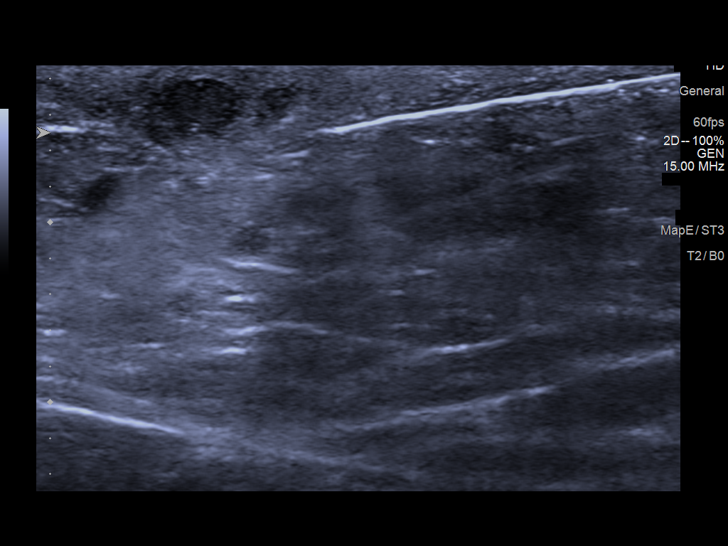
[im 2/9]
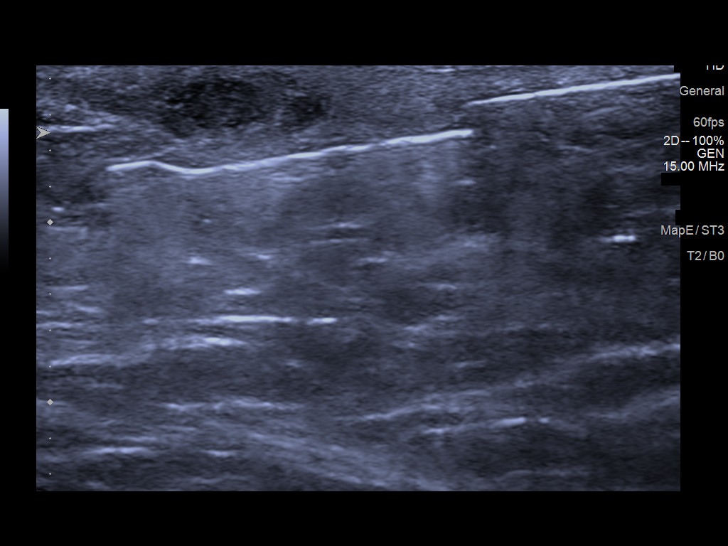
[im 3/9]
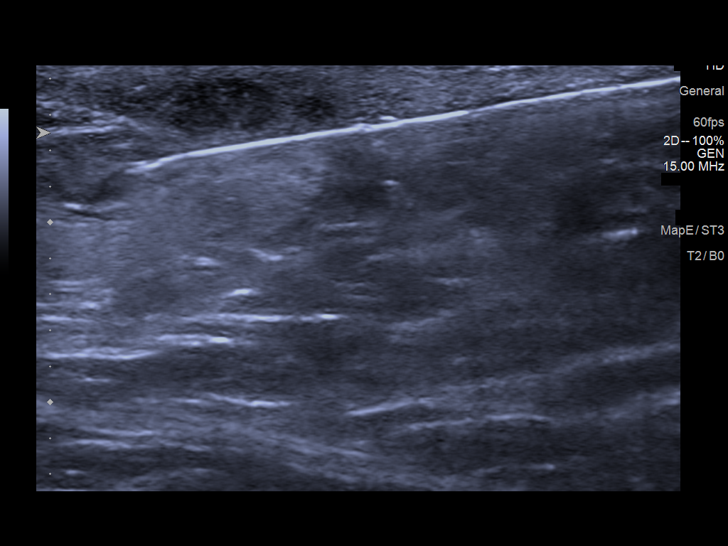
[im 4/9]
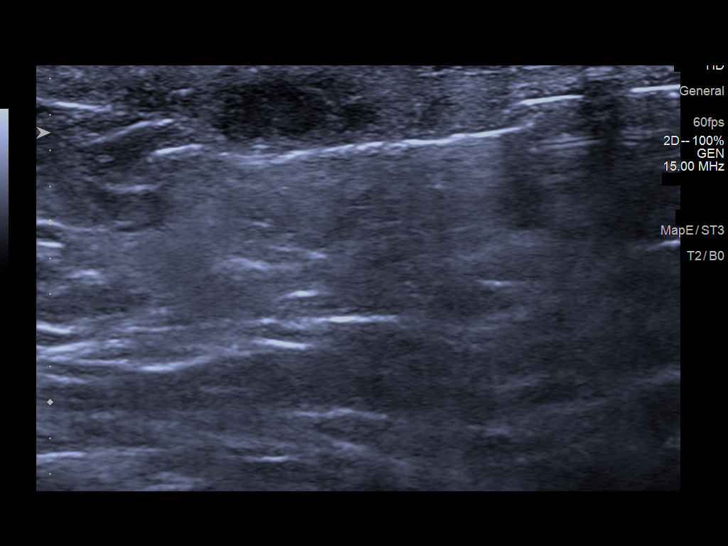
[im 5/9]
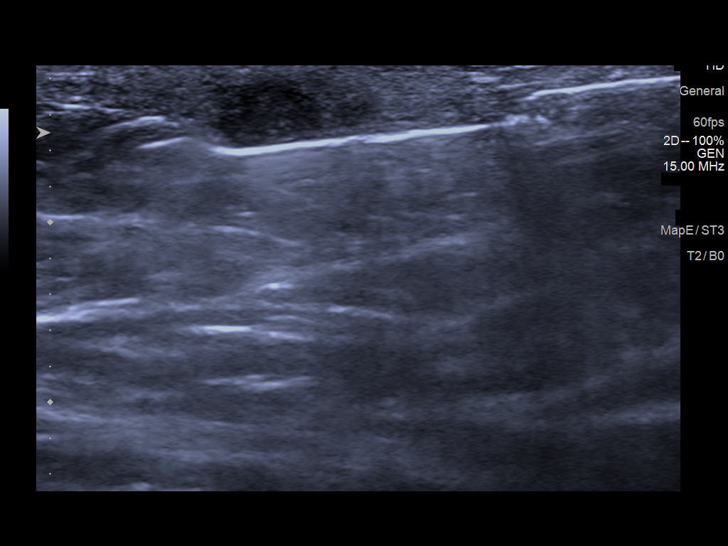
[im 6/9]
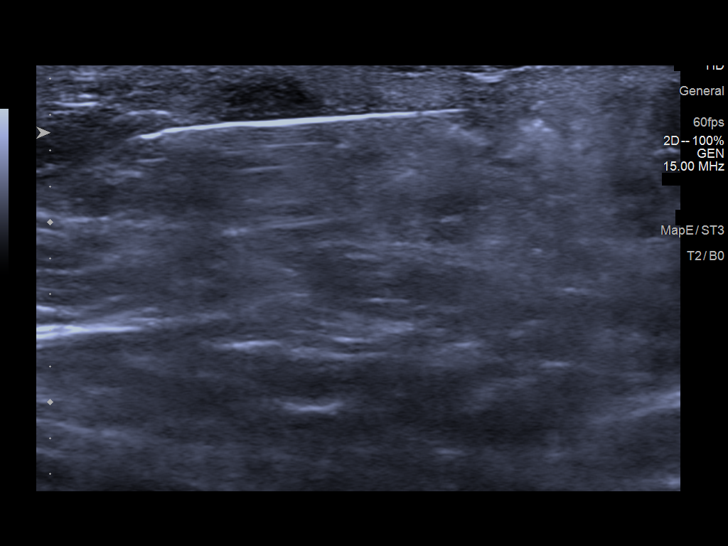
[im 7/9]
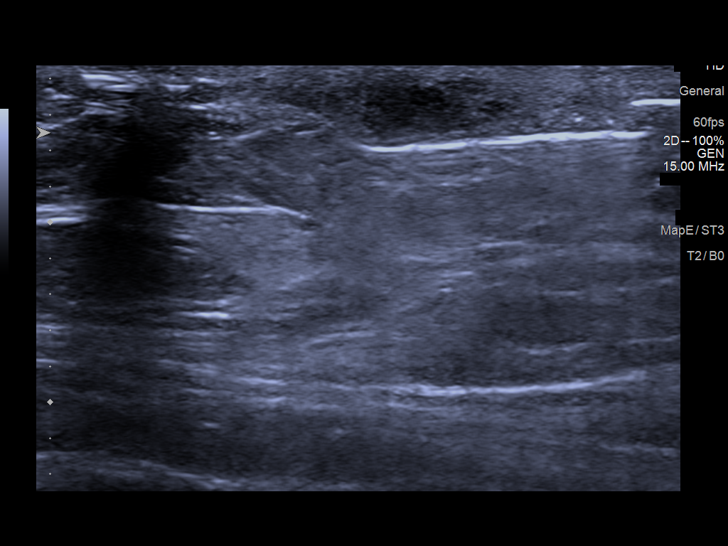
[im 8/9]
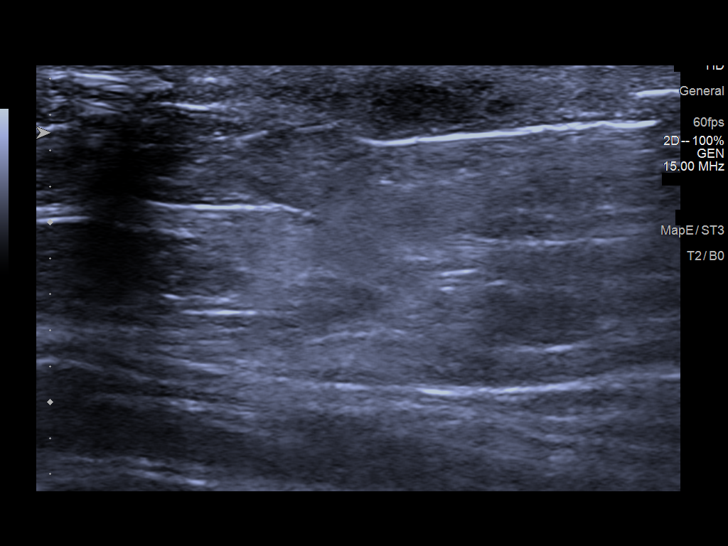
[im 9/9]
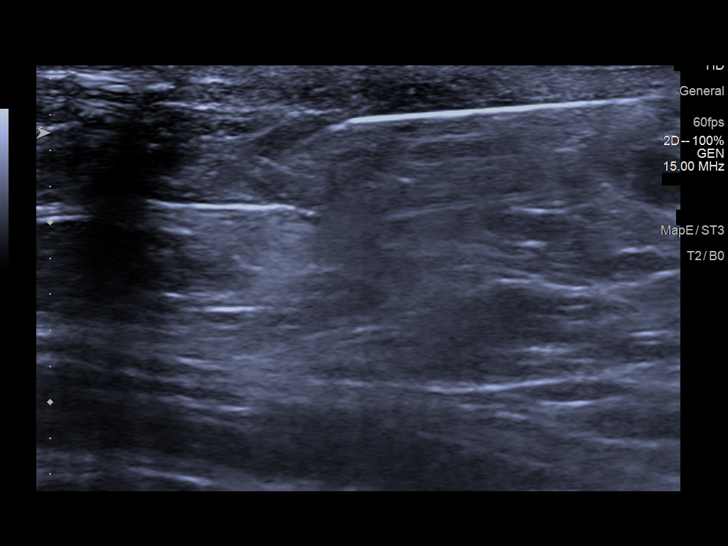

[9 of 9 positions shown; findings below may reference images not displayed]

PROCEDURE:
I met with the patient and we discussed the procedure of
ultrasound-guided biopsy, including benefits and alternatives. We
discussed the high likelihood of a successful procedure. We
discussed the risks of the procedure, including infection, bleeding,
tissue injury, risk of subsequent visit to return for clip
placement, aseptic inflammation as a result of rupturing an
epidermal inclusion cyst, and inadequate sampling. Informed written
consent was given. The usual time-out protocol was performed
immediately prior to the procedure.

Lesion quadrant: Upper outer quadrant

On today's exam, the mass appears intradermal in location with a
favored tract to the skin noted. Given appearance of a benign
intradermal lesion on today's exam, discussion was held with patient
regarding option for short-term follow-up versus definitive
characterization with biopsy. Patient elected to proceed with
biopsy. Using sterile technique and 1% lidocaine and 1% lidocaine
with epinephrine as local anesthetic, under direct ultrasound
visualization, a 14 gauge Agaetisson device was used to perform
biopsy of a LEFT axillary mass using an inferolateral approach. Clip
placement was deferred due to favored intradermal location.
IMPRESSION: 1. Ultrasound guided biopsy of a favored axillary LEFT intradermal
mass. No apparent complications.

2. Discussion was held with patient regarding potential for aseptic
inflammation as a result of rupturing a potential epidermal
inclusion cyst. She expressed her understanding.

3. Clip placement was deferred at this time due to favored
intradermal location. Patient was counseled that if there are
malignant results of breast primary, then she will have to return
for clip placement. She expressed her understanding

ADDENDUM:
PATHOLOGY revealed: A. LYMPH NODE, LEFT AXILLA; ULTRASOUND-GUIDED
BIOPSY: - ABSCESS WITH FOREIGN BODY GIANT CELL REACTION. - FOCAL
BENIGN BREAST TISSUE. - NEGATIVE FOR ATYPIA AND MALIGNANCY. Comment:
Special stains for acid fast organisms and fungal elements (AFB and
SHEKHAR) are negative. Controls stain appropriately.

Pathology results are CONCORDANT with imaging findings, per Dr.
Nicolad Mirque.

Pathology results and recommendations were discussed with patient
via telephone on 04/01/2020. Patient reported biopsy site doing well
with no adverse symptoms, and only slight tenderness at the site.
Post biopsy care instructions were reviewed, questions were answered
and my direct phone number was provided. Patient was instructed to
call [HOSPITAL] for any additional questions or concerns
related to biopsy site.

RECOMMENDATIONS:
RECOMMENDATIONS
1. Followup with provider for consideration of antibiotic therapy
given pathology results of Abscess.

2. If patient desires definitive removal of the dermal lesion,
recommend dermatology referral from provider.

3. Patient instructed to continue monthly self breast examinations
and resume annual bilateral screening mammogram due February 2022.

NOTE: I spoke with Rockwood Do Good RN at provider office on 04/01/2021, and
she will relay recommendations to provider and notify patient with
treatment plan.

Pathology results reported by Mirqesem Vinsdon RN on 04/02/2021.

*** End of Addendum ***
PROCEDURE:
I met with the patient and we discussed the procedure of
ultrasound-guided biopsy, including benefits and alternatives. We
discussed the high likelihood of a successful procedure. We
discussed the risks of the procedure, including infection, bleeding,
tissue injury, risk of subsequent visit to return for clip
placement, aseptic inflammation as a result of rupturing an
epidermal inclusion cyst, and inadequate sampling. Informed written
consent was given. The usual time-out protocol was performed
immediately prior to the procedure.

Lesion quadrant: Upper outer quadrant

On today's exam, the mass appears intradermal in location with a
favored tract to the skin noted. Given appearance of a benign
intradermal lesion on today's exam, discussion was held with patient
regarding option for short-term follow-up versus definitive
characterization with biopsy. Patient elected to proceed with
biopsy. Using sterile technique and 1% lidocaine and 1% lidocaine
with epinephrine as local anesthetic, under direct ultrasound
visualization, a 14 gauge Agaetisson device was used to perform
biopsy of a LEFT axillary mass using an inferolateral approach. Clip
placement was deferred due to favored intradermal location.
IMPRESSION: 1. Ultrasound guided biopsy of a favored axillary LEFT intradermal
mass. No apparent complications.

2. Discussion was held with patient regarding potential for aseptic
inflammation as a result of rupturing a potential epidermal
inclusion cyst. She expressed her understanding.

3. Clip placement was deferred at this time due to favored
intradermal location. Patient was counseled that if there are
malignant results of breast primary, then she will have to return
for clip placement. She expressed her understanding

## 2022-02-27 ENCOUNTER — Ambulatory Visit
Admission: RE | Admit: 2022-02-27 | Discharge: 2022-02-27 | Disposition: A | Discharge status: Home / Self Care | Payer: Medicare Other | Source: Ambulatory Visit | Attending: Family Medicine | Admitting: Family Medicine

## 2022-02-27 DIAGNOSIS — Z1231 Encounter for screening mammogram for malignant neoplasm of breast: Secondary | ICD-10-CM | POA: Insufficient documentation

## 2022-03-03 ENCOUNTER — Ambulatory Visit (INDEPENDENT_AMBULATORY_CARE_PROVIDER_SITE_OTHER): Payer: Medicare Other

## 2022-03-03 DIAGNOSIS — Z23 Encounter for immunization: Secondary | ICD-10-CM

## 2022-04-04 DIAGNOSIS — M2012 Hallux valgus (acquired), left foot: Secondary | ICD-10-CM | POA: Diagnosis not present

## 2022-04-04 DIAGNOSIS — M898X9 Other specified disorders of bone, unspecified site: Secondary | ICD-10-CM | POA: Diagnosis not present

## 2022-04-04 DIAGNOSIS — M778 Other enthesopathies, not elsewhere classified: Secondary | ICD-10-CM | POA: Diagnosis not present

## 2022-04-04 DIAGNOSIS — Q6689 Other  specified congenital deformities of feet: Secondary | ICD-10-CM | POA: Diagnosis not present

## 2022-04-04 DIAGNOSIS — M2011 Hallux valgus (acquired), right foot: Secondary | ICD-10-CM | POA: Diagnosis not present

## 2022-05-02 DIAGNOSIS — Z20822 Contact with and (suspected) exposure to covid-19: Secondary | ICD-10-CM | POA: Diagnosis not present

## 2022-05-02 DIAGNOSIS — R059 Cough, unspecified: Secondary | ICD-10-CM | POA: Diagnosis not present

## 2022-05-02 DIAGNOSIS — J069 Acute upper respiratory infection, unspecified: Secondary | ICD-10-CM | POA: Diagnosis not present

## 2022-07-13 ENCOUNTER — Other Ambulatory Visit: Payer: Self-pay | Admitting: Family Medicine

## 2022-07-13 DIAGNOSIS — I10 Essential (primary) hypertension: Secondary | ICD-10-CM

## 2022-07-14 NOTE — Telephone Encounter (Signed)
Requested medication (s) are due for refill today - expired rx  Requested medication (s) are on the active medication list -yes  Future visit scheduled -no  Last refill: 04/05/21 #[id number]  Notes to clinic: Attempted to call patient to schedule appointment- left message to call office. Expired Rx  Requested Prescriptions  Pending Prescriptions Disp Refills   lisinopril-hydrochlorothiazide (ZESTORETIC) 20-25 MG tablet [Pharmacy Med Name: Lisinopril-hydroCHLOROthiazide 20-25 MG Oral Tablet] 90 tablet 0    Sig: Take 1 tablet by mouth once daily     Cardiovascular:  ACEI + Diuretic Combos Failed - 07/13/2022  6:13 PM      Failed - Na in normal range and within 180 days    Sodium  Date Value Ref Range Status  08/20/2021 141 135 - 145 mmol/L Final  05/11/2012 140 136 - 145 mmol/L Final         Failed - K in normal range and within 180 days    Potassium  Date Value Ref Range Status  08/20/2021 3.3 (L) 3.5 - 5.1 mmol/L Final  05/11/2012 3.4 (L) 3.5 - 5.1 mmol/L Final         Failed - Cr in normal range and within 180 days    Creat  Date Value Ref Range Status  04/05/2021 0.80 0.50 - 1.05 mg/dL Final   Creatinine, Ser  Date Value Ref Range Status  08/20/2021 0.78 0.44 - 1.00 mg/dL Final         Failed - eGFR is 30 or above and within 180 days    EGFR (African American)  Date Value Ref Range Status  05/11/2012 >60  Final   GFR calc Af Amer  Date Value Ref Range Status  04/19/2018 >60 >60 mL/min Final   EGFR (Non-African Amer.)  Date Value Ref Range Status  05/11/2012 >60  Final    Comment:    eGFR values <34mL/min/1.73 m2 may be an indication of chronic kidney disease (CKD). Calculated eGFR is useful in patients with stable renal function. The eGFR calculation will not be reliable in acutely ill patients when serum creatinine is changing rapidly. It is not useful in  patients on dialysis. The eGFR calculation may not be applicable to patients at the low and high extremes  of body sizes, pregnant women, and vegetarians.    GFR, Estimated  Date Value Ref Range Status  08/20/2021 >60 >60 mL/min Final    Comment:    (NOTE) Calculated using the CKD-EPI Creatinine Equation (2021)    eGFR  Date Value Ref Range Status  04/05/2021 80 > OR = 60 mL/min/1.37m2 Final    Comment:    The eGFR is based on the CKD-EPI 2021 equation. To calculate  the new eGFR from a previous Creatinine or Cystatin C result, go to https://www.kidney.org/professionals/ kdoqi/gfr%5Fcalculator          Failed - Valid encounter within last 6 months    Recent Outpatient Visits           9 months ago Essential hypertension   [place] [facility] Smitty Cords, DO   1 year ago Axillary abscess   [place] [facility] [place], Netta Neat, DO   2 years ago Migraine without aura and without status migrainosus, not intractable   [place] [facility] [place], Netta Neat, DO   2 years ago Cough    [facility] Smitty Cords, DO   2 years ago Acute non-recurrent frontal  sinusitis   [place] [facility] Smitty Cords, [place]              Passed - Patient is not pregnant      Passed - Last BP in normal range    BP Readings from Last 1 Encounters:  10/07/21 130/61            Requested Prescriptions  Pending Prescriptions Disp Refills   lisinopril-hydrochlorothiazide (ZESTORETIC) 20-25 MG tablet [Pharmacy Med Name: Lisinopril-hydroCHLOROthiazide 20-25 MG Oral Tablet] 90 tablet 0    Sig: Take 1 tablet by mouth once daily     Cardiovascular:  ACEI + Diuretic Combos Failed - 07/13/2022  6:13 PM      Failed - Na in normal range and within 180 days    Sodium  Date Value Ref Range Status  08/20/2021 141 135 - 145 mmol/L Final  05/11/2012 140 136 - 145 mmol/L Final         Failed - K in normal range and within 180 days    Potassium  Date  Value Ref Range Status  08/20/2021 3.3 (L) 3.5 - 5.1 mmol/L Final  05/11/2012 3.4 (L) 3.5 - 5.1 mmol/L Final         Failed - Cr in normal range and within 180 days    Creat  Date Value Ref Range Status  04/05/2021 0.80 0.50 - 1.05 mg/dL Final   Creatinine, Ser  Date Value Ref Range Status  08/20/2021 0.78 0.44 - 1.00 mg/dL Final         Failed - eGFR is 30 or above and within 180 days    EGFR (African American)  Date Value Ref Range Status  05/11/2012 >60  Final   GFR calc Af Amer  Date Value Ref Range Status  04/19/2018 >60 >60 mL/min Final   EGFR (Non-African Amer.)  Date Value Ref Range Status  05/11/2012 >60  Final    Comment:    eGFR values <34mL/min/1.73 m2 may be an indication of chronic kidney disease (CKD). Calculated eGFR is useful in patients with stable renal function. The eGFR calculation will not be reliable in acutely ill patients when serum creatinine is changing rapidly. It is not useful in  patients on dialysis. The eGFR calculation may not be applicable to patients at the low and high extremes of body sizes, pregnant women, and vegetarians.    GFR, Estimated  Date Value Ref Range Status  08/20/2021 >60 >60 mL/min Final    Comment:    (NOTE) Calculated using the CKD-EPI Creatinine Equation (2021)    eGFR  Date Value Ref Range Status  04/05/2021 80 > OR = 60 mL/min/1.67m2 Final    Comment:    The eGFR is based on the CKD-EPI 2021 equation. To calculate  the new eGFR from a previous Creatinine or Cystatin C result, go to https://www.kidney.org/professionals/ kdoqi/gfr%5Fcalculator          Failed - Valid encounter within last 6 months    Recent Outpatient Visits           9 months ago Essential hypertension   [place] [facility] Smitty Cords, DO   1 year ago Axillary abscess   [facility] [place], Netta Neat, DO   2 years ago Migraine without aura and without status  migrainosus, not intractable    [facility] [place], Netta Neat, DO   2 years ago Cough     [facility] [place], Netta Neat, DO   2 years ago Acute non-recurrent frontal sinusitis   [place] [facility] [place], Netta Neat, [place] - Patient is not pregnant      Passed - Last BP in normal range    BP Readings from Last 1 Encounters:  10/07/21 130/61

## 2022-07-21 ENCOUNTER — Encounter: Payer: Self-pay | Admitting: Family Medicine

## 2022-07-21 ENCOUNTER — Ambulatory Visit (INDEPENDENT_AMBULATORY_CARE_PROVIDER_SITE_OTHER): Payer: 59 | Admitting: Family Medicine

## 2022-07-21 VITALS — BP 126/68 | HR 69 | Ht 60.0 in | Wt 174.0 lb

## 2022-07-21 DIAGNOSIS — I10 Essential (primary) hypertension: Secondary | ICD-10-CM

## 2022-07-21 DIAGNOSIS — J0110 Acute frontal sinusitis, unspecified: Secondary | ICD-10-CM | POA: Diagnosis not present

## 2022-07-21 DIAGNOSIS — R7309 Other abnormal glucose: Secondary | ICD-10-CM | POA: Diagnosis not present

## 2022-07-21 DIAGNOSIS — G43009 Migraine without aura, not intractable, without status migrainosus: Secondary | ICD-10-CM

## 2022-07-21 DIAGNOSIS — E785 Hyperlipidemia, unspecified: Secondary | ICD-10-CM | POA: Diagnosis not present

## 2022-07-21 DIAGNOSIS — R109 Unspecified abdominal pain: Secondary | ICD-10-CM | POA: Diagnosis not present

## 2022-07-21 DIAGNOSIS — Z23 Encounter for immunization: Secondary | ICD-10-CM | POA: Diagnosis not present

## 2022-07-21 DIAGNOSIS — E669 Obesity, unspecified: Secondary | ICD-10-CM | POA: Diagnosis not present

## 2022-07-21 MED ORDER — LISINOPRIL-HYDROCHLOROTHIAZIDE 20-25 MG PO TABS: 1.0000 | ORAL_TABLET | Freq: Every day | ORAL | 3 refills | Status: DC

## 2022-07-21 MED ORDER — RIZATRIPTAN BENZOATE 10 MG PO TBDP
10.0000 mg | ORAL_TABLET | ORAL | 2 refills | Status: AC | PRN
Start: 2022-07-21 — End: ?

## 2022-07-21 MED ORDER — AMOXICILLIN-POT CLAVULANATE 875-125 MG PO TABS
1.0000 | ORAL_TABLET | Freq: Two times a day (BID) | ORAL | 0 refills | Status: DC
Start: 2022-07-21 — End: 2023-05-21

## 2022-07-21 MED ORDER — SHINGRIX 50 MCG/0.5ML IM SUSR: INTRAMUSCULAR | 1 refills | Status: DC

## 2022-07-21 MED ORDER — NURTEC 75 MG PO TBDP
75.0000 mg | ORAL_TABLET | ORAL | 0 refills | Status: AC
Start: 2022-07-21 — End: ?

## 2022-07-21 NOTE — Patient Instructions (Addendum)
Thank you for coming to the office today.  We will treat the sinuses  Start antibiotic course for sinusitis This may be causing headache  Continue nasal steroid Flonase 2 sprays in each nostril daily for 4-6 weeks, may repeat course seasonally or as needed  Also we will cover migraines with better medication  Try Rizatriptan dissolving tablet for migraine, if you develop significant headache migraine, try this one, and if need repeat dose in 2 hours. Then that is max for 24 hours.  If that rx does not work for migraine headache, you can try the free sample, stronger med but it is more costly. Try Nurtec ODT 75mg  dissolve in mouth if not effective, can repeat in 24-48 hours later.  DUE for FASTING BLOOD WORK (no food or drink after midnight before the lab appointment, only water or coffee without cream/sugar on the morning of)  - Make sure Lab Only appointment is at about 1 week before your next appointment, so that results will be available  For Lab Results, once available within 2-3 days of blood draw, you can can log in to MyChart online to view your results and a brief explanation. Also, we can discuss results at next follow-up visit.    Please schedule a Follow-up Appointment to: Return in about 3 months (around 10/20/2022) for 1 day Tues 4/30 lab only 11am, then next apt 3 month follow-up HTN, Migraine/HAs.  If you have any other questions or concerns, please feel free to call the office or send a message through MyChart. You may also schedule an earlier appointment if necessary.  Additionally, you may be receiving a survey about your experience at our office within a few days to 1 week by e-mail or mail. We value your feedback.  Saralyn Pilar, DO [facility], [place]

## 2022-07-21 NOTE — Progress Notes (Signed)
Subjective:    Patient ID: Brittney Schmidt, female    DOB: 04-05-1950, 72 y.o.   MRN: [id number]  ALLAHNA HUSBAND is a 72 y.o. female presenting on 07/21/2022 for Medical Management of Chronic Issues and Hypertension   HPI  CHRONIC HTN / Obesity BMI >33 Reports no new concern. She is checking BP occasionally, doing well Picked up meds but has 0 refills Current Meds - Lisinopril-HCTZ 20-25mg  daily   Reports good compliance, took meds today. Tolerating well, w/o complaints. Lifestyle: She remains active - Diet: balanced, not particular diet, limits salt - Exercise: trying now to do more regular walking for exercise Denies CP, dyspnea, HA, edema, dizziness / lightheadedness   DYSLIPIDEMIA: - Reports no concerns. Last lipid panel 2023 needs re order will return tomorrow fasting Not on statin therapy She is not interested in statin at this time Lifestyle See above  Migraine Headaches Seems more frequent now, unsure triggers, question if sinus pressure headache or if it is related to migraine, has taken Imitrex previously with good results, now out of medication.  Sinusitis Describes sinus pressure headache congestion, persistent problem not resolving. Requesting treatment today.    Slept on her side and R Flank pain but it improved now   Health Maintenance: Last Mammo 02/2021 Colonoscopy 12/2018 DEXA Due       07/21/2022   11:48 AM 10/07/2021    9:52 AM 07/05/2021   10:35 AM  Depression screen PHQ 2/9  Decreased Interest 0 0 0  Down, Depressed, Hopeless 0 0 0  PHQ - 2 Score 0 0 0  Altered sleeping  0   Tired, decreased energy  0   Change in appetite  0   Feeling bad or failure about yourself   0   Trouble concentrating  0   Moving slowly or fidgety/restless  0   Suicidal thoughts  0   PHQ-9 Score  0   Difficult doing work/chores  Not difficult at all     Past Medical History:  Diagnosis Date   Ankle swelling    Hypertension    Wears dentures    full  upper, partial lower (doesn't wear lower)   Past Surgical History:  Procedure Laterality Date   ABDOMINAL HYSTERECTOMY     COLONOSCOPY WITH PROPOFOL N/A 01/18/2019   Procedure: COLONOSCOPY WITH PROPOFOL;  Surgeon: Pasty Spillers, MD;  Location: [facility];  Service: Endoscopy;  Laterality: N/A;   POLYPECTOMY  01/18/2019   Procedure: POLYPECTOMY;  Surgeon: Pasty Spillers, MD;  Location: [facility];  Service: Endoscopy;;   TUBAL LIGATION     Social History   Socioeconomic History   Marital status: Single    Spouse name: Not on file   Number of children: 3   Years of education: GED   Highest education level: Not on file  Occupational History   Occupation: unemployed (previously worked as temp)  Tobacco Use   Smoking status: Never   Smokeless tobacco: Never  Vaping Use   Vaping Use: Never used  Substance and Sexual Activity   Alcohol use: Yes    Alcohol/week: 2.0 standard drinks of alcohol    Types: 2 Cans of beer per week    Comment: occasional, planning to quit completely   Drug use: No   Sexual activity: Not Currently    Partners: Male  Other Topics Concern   Not on file  Social History Narrative   Has family that lives close by   Social Determinants  of Health   Financial Resource Strain: Low Risk  (07/05/2021)   Overall Financial Resource Strain (CARDIA)    Difficulty of Paying Living Expenses: Not hard at all  Food Insecurity: No Food Insecurity (07/05/2021)   Hunger Vital Sign    Worried About Running Out of Food in the Last Year: Never true    Ran Out of Food in the Last Year: Never true  Transportation Needs: No Transportation Needs (07/05/2021)   PRAPARE - Administrator, Civil Service (Medical): No    Lack of Transportation (Non-Medical): No  Physical Activity: Insufficiently Active (07/05/2021)   Exercise Vital Sign    Days of Exercise per Week: 2 days    Minutes of Exercise per Session: 20 min  Stress: No Stress  Concern Present (07/05/2021)   [employer] of Occupational Health - Occupational Stress Questionnaire    Feeling of Stress : Not at all  Social Connections: Moderately Isolated (07/05/2021)   Social Connection and Isolation Panel [NHANES]    Frequency of Communication with Friends and Family: More than three times a week    Frequency of Social Gatherings with Friends and Family: More than three times a week    Attends Religious Services: More than 4 times per year    Active Member of [employer] or Organizations: No    Attends Banker Meetings: Never    Marital Status: Never married  Intimate Partner Violence: Not At Risk (07/05/2021)   Humiliation, Afraid, Rape, and Kick questionnaire    Fear of Current or Ex-Partner: No    Emotionally Abused: No    Physically Abused: No    Sexually Abused: No   Family History  Problem Relation Age of Onset   Diabetes Mother    Hypertension Mother    Colon polyps Father    Heart disease Father    Heart attack Father    Cancer Brother    Hypertension Son    Breast cancer Neg Hx    Colon cancer Neg Hx    Current Outpatient Medications on File Prior to Visit  Medication Sig   aspirin 81 MG tablet Take 81 mg by mouth daily.   fluticasone (FLONASE) 50 MCG/ACT nasal spray Place 2 sprays into both nostrils daily. Use for 4-6 weeks then stop and use seasonally or as needed.   No current facility-administered medications on file prior to visit.    Review of Systems Per HPI unless specifically indicated above      Objective:    BP 126/68   Pulse 69   Ht 5' (1.524 m)   Wt 174 lb (78.9 kg)   SpO2 98%   BMI 33.98 kg/m   Wt Readings from Last 3 Encounters:  07/21/22 174 lb (78.9 kg)  10/07/21 175 lb (79.4 kg)  07/05/21 173 lb (78.5 kg)    Physical Exam Vitals and nursing note reviewed.  Constitutional:      General: She is not in acute distress.    Appearance: She is well-developed. She is not diaphoretic.     Comments:  Well-appearing, comfortable, cooperative  HENT:     Head: Normocephalic and atraumatic.     Comments: Turbinate edema nares    Right Ear: Ear canal and external ear normal. There is no impacted cerumen.     Left Ear: Ear canal and external ear normal. There is no impacted cerumen.     Ears:     Comments: TM with effusion bilateral mild    Nose:  No congestion.  Eyes:     General:        Right eye: No discharge.        Left eye: No discharge.     Conjunctiva/sclera: Conjunctivae normal.  Neck:     Thyroid: No thyromegaly.  Cardiovascular:     Rate and Rhythm: Normal rate and regular rhythm.     Heart sounds: Normal heart sounds. No murmur heard. Pulmonary:     Effort: Pulmonary effort is normal. No respiratory distress.     Breath sounds: Normal breath sounds. No wheezing or rales.  Musculoskeletal:        General: Normal range of motion.     Cervical back: Normal range of motion and neck supple.  Lymphadenopathy:     Cervical: No cervical adenopathy.  Skin:    General: Skin is warm and dry.     Findings: No erythema or rash.  Neurological:     Mental Status: She is alert and oriented to person, place, and time.  Psychiatric:        Behavior: Behavior normal.     Comments: Well groomed, good eye contact, normal speech and thoughts       Results for orders placed or performed during the hospital encounter of 08/20/21  Basic metabolic panel  Result Value Ref Range   Sodium 141 135 - 145 mmol/L   Potassium 3.3 (L) 3.5 - 5.1 mmol/L   Chloride 102 98 - 111 mmol/L   CO2 29 22 - 32 mmol/L   Glucose, Bld 86 70 - 99 mg/dL   BUN 14 8 - 23 mg/dL   Creatinine, Ser 1.61 0.44 - 1.00 mg/dL   Calcium 9.5 8.9 - 09.6 mg/dL   GFR, Estimated >04 >54 mL/min   Anion gap 10 5 - 15  CBC  Result Value Ref Range   WBC 5.9 4.0 - 10.5 K/uL   RBC 5.01 3.87 - 5.11 MIL/uL   Hemoglobin 13.6 12.0 - 15.0 g/dL   HCT 09.8 11.9 - 14.7 %   MCV 86.6 80.0 - 100.0 fL   MCH 27.1 26.0 - 34.0 pg    MCHC 31.3 30.0 - 36.0 g/dL   RDW 82.9 56.2 - 13.0 %   Platelets 215 150 - 400 K/uL   nRBC 0.0 0.0 - 0.2 %  Troponin I (High Sensitivity)  Result Value Ref Range   Troponin I (High Sensitivity) 4 <18 ng/L      Assessment & Plan:   Problem List Items Addressed This Visit     Dyslipidemia (high LDL; low HDL)   Relevant Orders   Lipid panel   TSH   Essential hypertension - Primary    Well-controlled HTN - Home BP readings none  No known complications    Plan:  1. Continue current BP regimen - Lisinopril-HCTZ 20-25mg  daily add refills 2. Encourage improved lifestyle - low sodium diet, regular exercise 3. Continue monitor BP outside office, bring readings to next visit, if persistently >140/90 or new symptoms notify office sooner      Relevant Medications   lisinopril-hydrochlorothiazide (ZESTORETIC) 20-25 MG tablet   Other Relevant Orders   COMPLETE METABOLIC PANEL WITH GFR   CBC with Differential/Platelet   Migraine without aura and without status migrainosus, not intractable   Relevant Medications   lisinopril-hydrochlorothiazide (ZESTORETIC) 20-25 MG tablet   rizatriptan (MAXALT-MLT) 10 MG disintegrating tablet   Rimegepant Sulfate (NURTEC) 75 MG TBDP   Obesity (BMI 30.0-34.9)   Other Visit Diagnoses  Need for shingles vaccine       Relevant Medications   SHINGRIX injection   Right flank pain       Acute non-recurrent frontal sinusitis       Relevant Medications   amoxicillin-clavulanate (AUGMENTIN) 875-125 MG tablet   Abnormal glucose       Relevant Orders   Hemoglobin A1c         Sinusitis May be source of headache We will treat the sinuses Start Augmentin antibiotic course for sinusitis Continue nasal steroid Flonase 2 sprays in each nostril daily for 4-6 weeks, may repeat course seasonally or as needed  Also we will cover migraines with better medication  Stop Imitrex Try Rizatriptan dissolving tablet for migraine, if you develop significant  headache migraine, try this one, and if need repeat dose in 2 hours. Then that is max for 24 hours.  If that rx does not work for migraine headache, you can try the free sample, stronger med but it is more costly. Try Nurtec ODT 75mg  dissolve in mouth if not effective, can repeat in 24-48 hours later.  Meds ordered this encounter  Medications   SHINGRIX injection    Sig: Inject 0.5 mL into muscle for shingles vaccine. Repeat dose in 2-6 months.    Dispense:  0.5 mL    Refill:  1   lisinopril-hydrochlorothiazide (ZESTORETIC) 20-25 MG tablet    Sig: Take 1 tablet by mouth daily.    Dispense:  90 tablet    Refill:  3    Future refills when ready   rizatriptan (MAXALT-MLT) 10 MG disintegrating tablet    Sig: Take 1 tablet (10 mg total) by mouth as needed for migraine. May repeat in 2 hours if needed    Dispense:  10 tablet    Refill:  2   amoxicillin-clavulanate (AUGMENTIN) 875-125 MG tablet    Sig: Take 1 tablet by mouth 2 (two) times daily.    Dispense:  20 tablet    Refill:  0   Rimegepant Sulfate (NURTEC) 75 MG TBDP    Sig: Take 1 tablet (75 mg total) by mouth as directed.    Dispense:  2 tablet    Refill:  0      Follow up plan: Return in about 3 months (around 10/20/2022) for 1 day Tues 4/30 lab only 11am, then next apt 3 month follow-up HTN, Migraine/HAs.  labs tomorrow Tues 11am  Saralyn Pilar, DO [facility] 07/21/2022, 11:54 AM

## 2022-07-21 NOTE — Assessment & Plan Note (Signed)
Well-controlled HTN - Home BP readings none  No known complications    Plan:  1. Continue current BP regimen - Lisinopril-HCTZ 20-25mg  daily add refills 2. Encourage improved lifestyle - low sodium diet, regular exercise 3. Continue monitor BP outside office, bring readings to next visit, if persistently >140/90 or new symptoms notify office sooner

## 2022-07-22 ENCOUNTER — Other Ambulatory Visit: Payer: 59

## 2022-07-22 DIAGNOSIS — E785 Hyperlipidemia, unspecified: Secondary | ICD-10-CM | POA: Diagnosis not present

## 2022-07-22 DIAGNOSIS — I10 Essential (primary) hypertension: Secondary | ICD-10-CM | POA: Diagnosis not present

## 2022-07-22 DIAGNOSIS — R7309 Other abnormal glucose: Secondary | ICD-10-CM

## 2022-07-23 LAB — COMPLETE METABOLIC PANEL WITH GFR
AG Ratio: 1.8 (calc) (ref 1.0–2.5)
ALT: 18 U/L (ref 6–29)
AST: 19 U/L (ref 10–35)
Albumin: 4.3 g/dL (ref 3.6–5.1)
Alkaline phosphatase (APISO): 46 U/L (ref 37–153)
BUN: 10 mg/dL (ref 7–25)
CO2: 28 mmol/L (ref 20–32)
Calcium: 9.5 mg/dL (ref 8.6–10.4)
Chloride: 104 mmol/L (ref 98–110)
Creat: 0.83 mg/dL (ref 0.50–1.05)
Globulin: 2.4 g/dL (calc) (ref 1.9–3.7)
Glucose, Bld: 92 mg/dL (ref 65–99)
Potassium: 3.6 mmol/L (ref 3.5–5.3)
Sodium: 141 mmol/L (ref 135–146)
Total Bilirubin: 0.8 mg/dL (ref 0.2–1.2)
Total Protein: 6.7 g/dL (ref 6.1–8.1)
eGFR: 76 mL/min/{1.73_m2} (ref >=60)

## 2022-07-23 LAB — LIPID PANEL
Cholesterol: 208 mg/dL — ABNORMAL HIGH (ref <200)
HDL: 48 mg/dL — ABNORMAL LOW (ref >=50)
LDL Cholesterol (Calc): 134 mg/dL (calc) — ABNORMAL HIGH
Non-HDL Cholesterol (Calc): 160 mg/dL (calc) — ABNORMAL HIGH (ref <130)
Total CHOL/HDL Ratio: 4.3 (calc) (ref <5.0)
Triglycerides: 132 mg/dL (ref <150)

## 2022-07-23 LAB — CBC WITH DIFFERENTIAL/PLATELET
Absolute Monocytes: 443 cells/uL (ref 200–950)
Basophils Absolute: 32 cells/uL (ref 0–200)
Basophils Relative: 0.6 %
Eosinophils Absolute: 70 cells/uL (ref 15–500)
Eosinophils Relative: 1.3 %
HCT: 39.9 % (ref 35.0–45.0)
Hemoglobin: 13.1 g/dL (ref 11.7–15.5)
Lymphs Abs: 2511 cells/uL (ref 850–3900)
MCH: 27.8 pg (ref 27.0–33.0)
MCHC: 32.8 g/dL (ref 32.0–36.0)
MCV: 84.5 fL (ref 80.0–100.0)
MPV: 11.1 fL (ref 7.5–12.5)
Monocytes Relative: 8.2 %
Neutro Abs: 2344 cells/uL (ref 1500–7800)
Neutrophils Relative %: 43.4 %
Platelets: 208 10*3/uL (ref 140–400)
RBC: 4.72 10*6/uL (ref 3.80–5.10)
RDW: 12.4 % (ref 11.0–15.0)
Total Lymphocyte: 46.5 %
WBC: 5.4 10*3/uL (ref 3.8–10.8)

## 2022-07-23 LAB — TSH: TSH: 1.01 mIU/L (ref 0.40–4.50)

## 2022-07-23 LAB — HEMOGLOBIN A1C
Hgb A1c MFr Bld: 5.9 % of total Hgb — ABNORMAL HIGH (ref <5.7)
Mean Plasma Glucose: 123 mg/dL
eAG (mmol/L): 6.8 mmol/L

## 2022-07-30 ENCOUNTER — Telehealth: Payer: Self-pay | Admitting: Family Medicine

## 2022-07-30 NOTE — Telephone Encounter (Signed)
Contacted Brittney Schmidt to schedule their annual wellness visit. Appointment made for 08/15/2022.  [name]; Care Guide Ambulatory Clinical Support [place] [facility] Direct Dial: [phone]

## 2022-08-15 ENCOUNTER — Ambulatory Visit (INDEPENDENT_AMBULATORY_CARE_PROVIDER_SITE_OTHER): Payer: 59

## 2022-08-15 VITALS — Ht 60.0 in | Wt 174.0 lb

## 2022-08-15 DIAGNOSIS — Z0000 Encounter for general adult medical examination without abnormal findings: Secondary | ICD-10-CM | POA: Diagnosis not present

## 2022-08-15 NOTE — Patient Instructions (Signed)
Brittney Schmidt , Thank you for taking time to come for your Medicare Wellness Visit. I appreciate your ongoing commitment to your health goals. Please review the following plan we discussed and let me know if I can assist you in the future.   These are the goals we discussed:  Goals      DIET - EAT MORE FRUITS AND VEGETABLES     DIET - INCREASE WATER INTAKE     Patient Stated     06/19/2020, no goals     Weight (lb) < 200 lb (90.7 kg)     Wants to be at 150 pounds in 3 months.        This is a list of the screening recommended for you and due dates:  Health Maintenance  Topic Date Due   Zoster (Shingles) Vaccine (1 of 2) Never done   DEXA scan (bone density measurement)  Never done   COVID-19 Vaccine (3 - 2023-24 season) 11/22/2021   Flu Shot  10/23/2022   Mammogram  02/28/2023   Medicare Annual Wellness Visit  08/15/2023   Colon Cancer Screening  01/18/2024   DTaP/Tdap/Td vaccine (2 - Td or Tdap) 10/16/2025   Pneumonia Vaccine  Completed   Hepatitis C Screening  Completed   HPV Vaccine  Aged Out    Advanced directives: no  Conditions/risks identified: none  Next appointment: Follow up in one year for your annual wellness visit 08/21/23 @ 9:45 am by phone   Preventive Care 65 Years and Older, Female Preventive care refers to lifestyle choices and visits with your health care provider that can promote health and wellness. What does preventive care include? A yearly physical exam. This is also called an annual well check. Dental exams once or twice a year. Routine eye exams. Ask your health care provider how often you should have your eyes checked. Personal lifestyle choices, including: Daily care of your teeth and gums. Regular physical activity. Eating a healthy diet. Avoiding tobacco and drug use. Limiting alcohol use. Practicing safe sex. Taking low-dose aspirin every day. Taking vitamin and mineral supplements as recommended by your health care provider. What  happens during an annual well check? The services and screenings done by your health care provider during your annual well check will depend on your age, overall health, lifestyle risk factors, and family history of disease. Counseling  Your health care provider may ask you questions about your: Alcohol use. Tobacco use. Drug use. Emotional well-being. Home and relationship well-being. Sexual activity. Eating habits. History of falls. Memory and ability to understand (cognition). Work and work Astronomer. Reproductive health. Screening  You may have the following tests or measurements: Height, weight, and BMI. Blood pressure. Lipid and cholesterol levels. These may be checked every 5 years, or more frequently if you are over 13 years old. Skin check. Lung cancer screening. You may have this screening every year starting at age 32 if you have a 30-pack-year history of smoking and currently smoke or have quit within the past 15 years. Fecal occult blood test (FOBT) of the stool. You may have this test every year starting at age 51. Flexible sigmoidoscopy or colonoscopy. You may have a sigmoidoscopy every 5 years or a colonoscopy every 10 years starting at age 49. Hepatitis C blood test. Hepatitis B blood test. Sexually transmitted disease (STD) testing. Diabetes screening. This is done by checking your blood sugar (glucose) after you have not eaten for a while (fasting). You may have this done every 1-3  years. Bone density scan. This is done to screen for osteoporosis. You may have this done starting at age 77. Mammogram. This may be done every 1-2 years. Talk to your health care provider about how often you should have regular mammograms. Talk with your health care provider about your test results, treatment options, and if necessary, the need for more tests. Vaccines  Your health care provider may recommend certain vaccines, such as: Influenza vaccine. This is recommended every  year. Tetanus, diphtheria, and acellular pertussis (Tdap, Td) vaccine. You may need a Td booster every 10 years. Zoster vaccine. You may need this after age 39. Pneumococcal 13-valent conjugate (PCV13) vaccine. One dose is recommended after age 50. Pneumococcal polysaccharide (PPSV23) vaccine. One dose is recommended after age 64. Talk to your health care provider about which screenings and vaccines you need and how often you need them. This information is not intended to replace advice given to you by your health care provider. Make sure you discuss any questions you have with your health care provider. Document Released: 04/06/2015 Document Revised: 11/28/2015 Document Reviewed: 01/09/2015 Elsevier Interactive Patient Education  2017 [employer].  Fall Prevention in the Home Falls can cause injuries. They can happen to people of all ages. There are many things you can do to make your home safe and to help prevent falls. What can I do on the outside of my home? Regularly fix the edges of walkways and driveways and fix any cracks. Remove anything that might make you trip as you walk through a door, such as a raised step or threshold. Trim any bushes or trees on the path to your home. Use bright outdoor lighting. Clear any walking paths of anything that might make someone trip, such as rocks or tools. Regularly check to see if handrails are loose or broken. Make sure that both sides of any steps have handrails. Any raised decks and porches should have guardrails on the edges. Have any leaves, snow, or ice cleared regularly. Use sand or salt on walking paths during winter. Clean up any spills in your garage right away. This includes oil or grease spills. What can I do in the bathroom? Use night lights. Install grab bars by the toilet and in the tub and shower. Do not use towel bars as grab bars. Use non-skid mats or decals in the tub or shower. If you need to sit down in the shower, use a  plastic, non-slip stool. Keep the floor dry. Clean up any water that spills on the floor as soon as it happens. Remove soap buildup in the tub or shower regularly. Attach bath mats securely with double-sided non-slip rug tape. Do not have throw rugs and other things on the floor that can make you trip. What can I do in the bedroom? Use night lights. Make sure that you have a light by your bed that is easy to reach. Do not use any sheets or blankets that are too big for your bed. They should not hang down onto the floor. Have a firm chair that has side arms. You can use this for support while you get dressed. Do not have throw rugs and other things on the floor that can make you trip. What can I do in the kitchen? Clean up any spills right away. Avoid walking on wet floors. Keep items that you use a lot in easy-to-reach places. If you need to reach something above you, use a strong step stool that has a grab  bar. Keep electrical cords out of the way. Do not use floor polish or wax that makes floors slippery. If you must use wax, use non-skid floor wax. Do not have throw rugs and other things on the floor that can make you trip. What can I do with my stairs? Do not leave any items on the stairs. Make sure that there are handrails on both sides of the stairs and use them. Fix handrails that are broken or loose. Make sure that handrails are as long as the stairways. Check any carpeting to make sure that it is firmly attached to the stairs. Fix any carpet that is loose or worn. Avoid having throw rugs at the top or bottom of the stairs. If you do have throw rugs, attach them to the floor with carpet tape. Make sure that you have a light switch at the top of the stairs and the bottom of the stairs. If you do not have them, ask someone to add them for you. What else can I do to help prevent falls? Wear shoes that: Do not have high heels. Have rubber bottoms. Are comfortable and fit you  well. Are closed at the toe. Do not wear sandals. If you use a stepladder: Make sure that it is fully opened. Do not climb a closed stepladder. Make sure that both sides of the stepladder are locked into place. Ask someone to hold it for you, if possible. Clearly mark and make sure that you can see: Any grab bars or handrails. First and last steps. Where the edge of each step is. Use tools that help you move around (mobility aids) if they are needed. These include: Canes. Walkers. Scooters. Crutches. Turn on the lights when you go into a dark area. Replace any light bulbs as soon as they burn out. Set up your furniture so you have a clear path. Avoid moving your furniture around. If any of your floors are uneven, fix them. If there are any pets around you, be aware of where they are. Review your medicines with your doctor. Some medicines can make you feel dizzy. This can increase your chance of falling. Ask your doctor what other things that you can do to help prevent falls. This information is not intended to replace advice given to you by your health care provider. Make sure you discuss any questions you have with your health care provider. Document Released: 01/04/2009 Document Revised: 08/16/2015 Document Reviewed: 04/14/2014 Elsevier Interactive Patient Education  2017 [employer].

## 2022-08-15 NOTE — Progress Notes (Signed)
I connected with  Brittney Schmidt on 08/15/22 by a audio enabled telemedicine application and verified that I am speaking with the correct person using two identifiers.  Patient Location: Home  Provider Location: Office/Clinic  I discussed the limitations of evaluation and management by telemedicine. The patient expressed understanding and agreed to proceed.  Subjective:   Brittney Schmidt is a 72 y.o. female who presents for Medicare Annual (Subsequent) preventive examination.  Review of Systems     Cardiac Risk Factors include: advanced age (>23men, >43 women);hypertension;dyslipidemia     Objective:    There were no vitals filed for this visit. There is no height or weight on file to calculate BMI.     08/15/2022   10:36 AM 08/20/2021   12:03 PM 07/05/2021   10:38 AM 06/19/2020   11:43 AM 01/18/2019    9:42 AM 11/16/2018   10:26 AM 04/19/2018   11:24 AM  Advanced Directives  Does Patient Have a Medical Advance Directive? No No No No No No No  Would patient like information on creating a medical advance directive? No - Patient declined  No - Patient declined  No - Patient declined      Current Medications (verified) Outpatient Encounter Medications as of 08/15/2022  Medication Sig   aspirin 81 MG tablet Take 81 mg by mouth daily.   fluticasone (FLONASE) 50 MCG/ACT nasal spray Place 2 sprays into both nostrils daily. Use for 4-6 weeks then stop and use seasonally or as needed.   lisinopril-hydrochlorothiazide (ZESTORETIC) 20-25 MG tablet Take 1 tablet by mouth daily.   Rimegepant Sulfate (NURTEC) 75 MG TBDP Take 1 tablet (75 mg total) by mouth as directed.   rizatriptan (MAXALT-MLT) 10 MG disintegrating tablet Take 1 tablet (10 mg total) by mouth as needed for migraine. May repeat in 2 hours if needed   [facility] injection Inject 0.5 mL into muscle for shingles vaccine. Repeat dose in 2-6 months.   amoxicillin-clavulanate (AUGMENTIN) 875-125 MG tablet Take 1 tablet by mouth 2  (two) times daily. (Patient not taking: Reported on 08/15/2022)   No facility-administered encounter medications on file as of 08/15/2022.    Allergies (verified) Patient has no known allergies.   History: Past Medical History:  Diagnosis Date   Ankle swelling    Hypertension    Wears dentures    full upper, partial lower (doesn't wear lower)   Past Surgical History:  Procedure Laterality Date   ABDOMINAL HYSTERECTOMY     COLONOSCOPY WITH PROPOFOL N/A 01/18/2019   Procedure: COLONOSCOPY WITH PROPOFOL;  Surgeon: Pasty Spillers, MD;  Location: [facility];  Service: Endoscopy;  Laterality: N/A;   POLYPECTOMY  01/18/2019   Procedure: POLYPECTOMY;  Surgeon: Pasty Spillers, MD;  Location: [facility];  Service: Endoscopy;;   TUBAL LIGATION     Family History  Problem Relation Age of Onset   Diabetes Mother    Hypertension Mother    Colon polyps Father    Heart disease Father    Heart attack Father    Cancer Brother    Hypertension Son    Breast cancer Neg Hx    Colon cancer Neg Hx    Social History   Socioeconomic History   Marital status: Single    Spouse name: Not on file   Number of children: 3   Years of education: GED   Highest education level: Not on file  Occupational History   Occupation: unemployed (previously worked as temp)  Tobacco Use  Smoking status: Never   Smokeless tobacco: Never  Vaping Use   Vaping Use: Never used  Substance and Sexual Activity   Alcohol use: Yes    Alcohol/week: 2.0 standard drinks of alcohol    Types: 2 Cans of beer per week    Comment: occasional, planning to quit completely   Drug use: No   Sexual activity: Not Currently    Partners: Male  Other Topics Concern   Not on file  Social History Narrative   Has family that lives close by   Social Determinants of Health   Financial Resource Strain: Low Risk  (08/15/2022)   Overall Financial Resource Strain (CARDIA)    Difficulty of Paying Living  Expenses: Not hard at all  Food Insecurity: No Food Insecurity (08/15/2022)   Hunger Vital Sign    Worried About Running Out of Food in the Last Year: Never true    Ran Out of Food in the Last Year: Never true  Transportation Needs: No Transportation Needs (08/15/2022)   PRAPARE - Administrator, Civil Service (Medical): No    Lack of Transportation (Non-Medical): No  Physical Activity: Sufficiently Active (08/15/2022)   Exercise Vital Sign    Days of Exercise per Week: 7 days    Minutes of Exercise per Session: 30 min  Stress: No Stress Concern Present (08/15/2022)   [employer] of Occupational Health - Occupational Stress Questionnaire    Feeling of Stress : Only a little  Social Connections: Moderately Isolated (08/15/2022)   Social Connection and Isolation Panel [NHANES]    Frequency of Communication with Friends and Family: More than three times a week    Frequency of Social Gatherings with Friends and Family: More than three times a week    Attends Religious Services: More than 4 times per year    Active Member of [employer] or Organizations: No    Attends Engineer, structural: Never    Marital Status: Never married    Tobacco Counseling Counseling given: Not Answered   Clinical Intake:  Pre-visit preparation completed: Yes  Pain : No/denies pain     Nutritional Risks: None Diabetes: No  How often do you need to have someone help you when you read instructions, pamphlets, or other written materials from your doctor or pharmacy?: 1 - Never  Diabetic?no  Interpreter Needed?: No  Information entered by :: Kennedy Bucker, LPN   Activities of Daily Living    08/15/2022   10:37 AM 07/21/2022   11:48 AM  In your present state of health, do you have any difficulty performing the following activities:  Hearing? 1 1  Vision? 1 1  Difficulty concentrating or making decisions? 1 1  Walking or climbing stairs? 0 0  Dressing or bathing? 0 0  Doing  errands, shopping? 0 0  Preparing Food and eating ? N   Using the Toilet? N   In the past six months, have you accidently leaked urine? N   Do you have problems with loss of bowel control? N   Managing your Medications? N   Managing your Finances? N   Housekeeping or managing your Housekeeping? N     Patient Care Team: Smitty Cords, DO as PCP - General (Family Medicine) Jim Like, RN as Registered Nurse Scarlett Presto, RN (Inactive) as Registered Nurse  Indicate any recent Medical Services you may have received from other than [facility] providers in the past year (date may be approximate).  Assessment:   This is a routine wellness examination for Aarini.  Hearing/Vision screen Hearing Screening - Comments:: No aids Vision Screening - Comments:: Wears glasses- [facility]  Dietary issues and exercise activities discussed: Current Exercise Habits: Home exercise routine, Type of exercise: walking, Time (Minutes): 30, Frequency (Times/Week): 7, Weekly Exercise (Minutes/Week): 210, Intensity: Mild   Goals Addressed             This Visit's Progress    DIET - INCREASE WATER INTAKE         Depression Screen    08/15/2022   10:35 AM 07/21/2022   11:48 AM 10/07/2021    9:52 AM 07/05/2021   10:35 AM 06/19/2020   11:45 AM 11/02/2019   11:42 AM 07/15/2019    1:39 PM  PHQ 2/9 Scores  PHQ - 2 Score 1 0 0 0 0 0 0  PHQ- 9 Score 1  0        Fall Risk    08/15/2022   10:37 AM 07/21/2022   11:48 AM 10/07/2021    9:52 AM 07/05/2021   10:40 AM 06/19/2020   11:45 AM  Fall Risk   Falls in the past year? 0 0 0 0 0  Number falls in past yr: 0  0 0   Injury with Fall? 0  0 0   Risk for fall due to : No Fall Risks  No Fall Risks No Fall Risks Medication side effect  Follow up Falls prevention discussed;Falls evaluation completed  Falls evaluation completed Falls evaluation completed Falls evaluation completed;Education provided;Falls prevention discussed    FALL RISK  PREVENTION PERTAINING TO THE HOME:  Any stairs in or around the home? Yes  If so, are there any without handrails? No  Home free of loose throw rugs in walkways, pet beds, electrical cords, etc? Yes  Adequate lighting in your home to reduce risk of falls? Yes   ASSISTIVE DEVICES UTILIZED TO PREVENT FALLS:  Life alert? No  Use of a cane, walker or w/c? No  Grab bars in the bathroom? No  Shower chair or bench in shower? No  Elevated toilet seat or a handicapped toilet? No    Cognitive Function:    10/28/2017    3:37 PM  MMSE - Mini Mental State Exam  Orientation to time 5  Orientation to Place 5  Registration 3  Attention/ Calculation 5  Recall 1  Language- name 2 objects 2  Language- repeat 1  Language- follow 3 step command 3  Language- read & follow direction 1  Write a sentence 1  Copy design 1  Total score 28        08/15/2022   10:41 AM 06/19/2020   11:48 AM 11/16/2018   10:31 AM  6CIT Screen  What Year? 0 points 0 points 0 points  What month? 0 points 0 points 0 points  What time? 0 points 0 points 0 points  Count back from 20 0 points 0 points 0 points  Months in reverse 0 points 2 points 0 points  Repeat phrase 4 points 6 points 0 points  Total Score 4 points 8 points 0 points    Immunizations Immunization History  Administered Date(s) Administered   Fluad Quad(high Dose 65+) 12/07/2018, 01/05/2020, 03/03/2022   Influenza-Unspecified 04/14/2018   PFIZER(Purple Top)SARS-COV-2 Vaccination 06/16/2019, 07/12/2019   Pneumococcal Conjugate-13 10/28/2017   Pneumococcal Polysaccharide-23 11/16/2018   Tdap 10/17/2015    TDAP status: Up to date  Flu Vaccine status: Up to date  Pneumococcal vaccine status: Up to date  Covid-19 vaccine status: Completed vaccines  Qualifies for Shingles Vaccine? Yes   Zostavax completed No   Shingrix Completed?: No.    Education has been provided regarding the importance of this vaccine. Patient has been advised to call  insurance company to determine out of pocket expense if they have not yet received this vaccine. Advised may also receive vaccine at local pharmacy or Health Dept. Verbalized acceptance and understanding.  Screening Tests Health Maintenance  Topic Date Due   Zoster Vaccines- Shingrix (1 of 2) Never done   DEXA SCAN  Never done   COVID-19 Vaccine (3 - 2023-24 season) 11/22/2021   INFLUENZA VACCINE  10/23/2022   MAMMOGRAM  02/28/2023   Medicare Annual Wellness (AWV)  08/15/2023   Colonoscopy  01/18/2024   DTaP/Tdap/Td (2 - Td or Tdap) 10/16/2025   Pneumonia Vaccine 28+ Years old  Completed   Hepatitis C Screening  Completed   HPV VACCINES  Aged Out    Health Maintenance  Health Maintenance Due  Topic Date Due   Zoster Vaccines- Shingrix (1 of 2) Never done   DEXA SCAN  Never done   COVID-19 Vaccine (3 - 2023-24 season) 11/22/2021    Colorectal cancer screening: Type of screening: Colonoscopy. Completed 01/18/19. Repeat every 5 years  Mammogram status: Completed 02/27/22. Repeat every year  Bone Density status: Ordered 10/07/21. Pt provided with contact info and advised to call to schedule appt.  Lung Cancer Screening: (Low Dose CT Chest recommended if Age 28-80 years, 30 pack-year currently smoking OR have quit w/in 15years.) does not qualify.   Additional Screening:  Hepatitis C Screening: does qualify; Completed 06/21/21  Vision Screening: Recommended annual ophthalmology exams for early detection of glaucoma and other disorders of the eye. Is the patient up to date with their annual eye exam?  Yes  Who is the provider or what is the name of the office in which the patient attends annual eye exams? [facility]  If pt is not established with a provider, would they like to be referred to a provider to establish care? No .   Dental Screening: Recommended annual dental exams for proper oral hygiene  Community Resource Referral / Chronic Care Management: CRR required this  visit?  No   CCM required this visit?  No      Plan:     I have personally reviewed and noted the following in the patient's chart:   Medical and social history Use of alcohol, tobacco or illicit drugs  Current medications and supplements including opioid prescriptions. Patient is not currently taking opioid prescriptions. Functional ability and status Nutritional status Physical activity Advanced directives List of other physicians Hospitalizations, surgeries, and ER visits in previous 12 months Vitals Screenings to include cognitive, depression, and falls Referrals and appointments  In addition, I have reviewed and discussed with patient certain preventive protocols, quality metrics, and best practice recommendations. A written personalized care plan for preventive services as well as general preventive health recommendations were provided to patient.     Hal Hope, LPN   06/30/8117   Nurse Notes: none

## 2022-09-10 DIAGNOSIS — M2042 Other hammer toe(s) (acquired), left foot: Secondary | ICD-10-CM | POA: Diagnosis not present

## 2022-09-10 DIAGNOSIS — Q6689 Other  specified congenital deformities of feet: Secondary | ICD-10-CM | POA: Diagnosis not present

## 2022-09-10 DIAGNOSIS — M778 Other enthesopathies, not elsewhere classified: Secondary | ICD-10-CM | POA: Diagnosis not present

## 2022-09-10 DIAGNOSIS — M2011 Hallux valgus (acquired), right foot: Secondary | ICD-10-CM | POA: Diagnosis not present

## 2022-09-10 DIAGNOSIS — M2012 Hallux valgus (acquired), left foot: Secondary | ICD-10-CM | POA: Diagnosis not present

## 2022-09-10 DIAGNOSIS — M898X9 Other specified disorders of bone, unspecified site: Secondary | ICD-10-CM | POA: Diagnosis not present

## 2022-10-20 ENCOUNTER — Ambulatory Visit: Payer: 59 | Admitting: Family Medicine

## 2022-11-06 DIAGNOSIS — H25813 Combined forms of age-related cataract, bilateral: Secondary | ICD-10-CM | POA: Diagnosis not present

## 2023-01-07 ENCOUNTER — Ambulatory Visit (INDEPENDENT_AMBULATORY_CARE_PROVIDER_SITE_OTHER): Payer: 59

## 2023-01-07 DIAGNOSIS — Z23 Encounter for immunization: Secondary | ICD-10-CM | POA: Diagnosis not present

## 2023-01-20 ENCOUNTER — Other Ambulatory Visit: Payer: Self-pay | Admitting: Family Medicine

## 2023-01-20 DIAGNOSIS — Z1231 Encounter for screening mammogram for malignant neoplasm of breast: Secondary | ICD-10-CM

## 2023-02-18 DIAGNOSIS — M79675 Pain in left toe(s): Secondary | ICD-10-CM | POA: Diagnosis not present

## 2023-02-18 DIAGNOSIS — M778 Other enthesopathies, not elsewhere classified: Secondary | ICD-10-CM | POA: Diagnosis not present

## 2023-02-18 DIAGNOSIS — Q6689 Other  specified congenital deformities of feet: Secondary | ICD-10-CM | POA: Diagnosis not present

## 2023-02-18 DIAGNOSIS — M79674 Pain in right toe(s): Secondary | ICD-10-CM | POA: Diagnosis not present

## 2023-02-18 DIAGNOSIS — M898X9 Other specified disorders of bone, unspecified site: Secondary | ICD-10-CM | POA: Diagnosis not present

## 2023-02-18 DIAGNOSIS — B351 Tinea unguium: Secondary | ICD-10-CM | POA: Diagnosis not present

## 2023-02-18 DIAGNOSIS — L600 Ingrowing nail: Secondary | ICD-10-CM | POA: Diagnosis not present

## 2023-02-18 DIAGNOSIS — M2042 Other hammer toe(s) (acquired), left foot: Secondary | ICD-10-CM | POA: Diagnosis not present

## 2023-03-03 ENCOUNTER — Ambulatory Visit
Admission: RE | Admit: 2023-03-03 | Discharge: 2023-03-03 | Disposition: A | Discharge status: Home / Self Care | Payer: 59 | Source: Ambulatory Visit | Attending: Family Medicine | Admitting: Family Medicine

## 2023-03-03 DIAGNOSIS — Z1231 Encounter for screening mammogram for malignant neoplasm of breast: Secondary | ICD-10-CM | POA: Insufficient documentation

## 2023-05-21 ENCOUNTER — Ambulatory Visit (INDEPENDENT_AMBULATORY_CARE_PROVIDER_SITE_OTHER): Payer: 59 | Admitting: Family Medicine

## 2023-05-21 ENCOUNTER — Encounter: Payer: Self-pay | Admitting: Family Medicine

## 2023-05-21 VITALS — BP 120/78 | HR 82 | Ht 60.0 in | Wt 171.0 lb

## 2023-05-21 DIAGNOSIS — E785 Hyperlipidemia, unspecified: Secondary | ICD-10-CM

## 2023-05-21 DIAGNOSIS — E66811 Obesity, class 1: Secondary | ICD-10-CM

## 2023-05-21 DIAGNOSIS — I10 Essential (primary) hypertension: Secondary | ICD-10-CM | POA: Diagnosis not present

## 2023-05-21 DIAGNOSIS — R7309 Other abnormal glucose: Secondary | ICD-10-CM

## 2023-05-21 DIAGNOSIS — Z23 Encounter for immunization: Secondary | ICD-10-CM | POA: Diagnosis not present

## 2023-05-21 DIAGNOSIS — Z0000 Encounter for general adult medical examination without abnormal findings: Secondary | ICD-10-CM

## 2023-05-21 DIAGNOSIS — Z780 Asymptomatic menopausal state: Secondary | ICD-10-CM | POA: Diagnosis not present

## 2023-05-21 NOTE — Progress Notes (Signed)
 Subjective:    Patient ID: Brittney Schmidt, female    DOB: 1950-07-03, 73 y.o.   MRN: [id number]  Brittney Schmidt is a 73 y.o. female presenting on 05/21/2023 for Annual Exam   HPI  Discussed the use of AI scribe software for clinical note transcription with the patient, who gave verbal consent to proceed.  History of Present Illness   Brittney Schmidt is a 73 year old female who presents for an annual physical exam.  She experienced a cold about two weeks ago, which lasted for approximately two weeks. During this time, she had symptoms of coughing, back pain, and head congestion. She managed these symptoms with cough syrup and other medications, and she has since recovered. No current pain or swelling in her feet or ankles.  She mentions the emotional impact of losing her youngest son last year. She describes feeling 'up and down' emotionally and relies on support from her family, friends, and church members.      CHRONIC HTN / Obesity BMI >33 Reports no new concern. She is checking BP occasionally, doing well Still has meds Current Meds - Lisinopril-HCTZ 20-25mg  daily   Reports good compliance, took meds today. Tolerating well, w/o complaints. Lifestyle: She remains active - Diet: balanced, not particular diet, limits salt - Exercise: trying now to do more regular walking for exercise Denies CP, dyspnea, HA, edema, dizziness / lightheadedness   DYSLIPIDEMIA: - Reports no concerns. Last lipid panel 2024 needs re order will return tomorrow fasting Not on statin therapy Lifestyle See above  Migraine Headaches Episodic Has Rizatriptan ODT PRN    Health Maintenance: Last Mammo 02/2021 Colonoscopy 12/2018 DEXA Due       05/21/2023   12:47 PM 08/15/2022   10:35 AM 07/21/2022   11:48 AM  Depression screen PHQ 2/9  Decreased Interest 0 0 0  Down, Depressed, Hopeless 0 1 0  PHQ - 2 Score 0 1 0  Altered sleeping  0   Tired, decreased energy  0   Change in appetite  0    Feeling bad or failure about yourself   0   Trouble concentrating  0   Moving slowly or fidgety/restless  0   Suicidal thoughts  0   PHQ-9 Score  1   Difficult doing work/chores  Not difficult at all        07/21/2022   11:48 AM 10/07/2021    9:52 AM  GAD 7 : Generalized Anxiety Score  Nervous, Anxious, on Edge 0 0  Control/stop worrying 0 0  Worry too much - different things 0 0  Trouble relaxing 0 0  Restless 0 0  Easily annoyed or irritable 0 0  Afraid - awful might happen 0 0  Total GAD 7 Score 0 0  Anxiety Difficulty  Not difficult at all     Past Medical History:  Diagnosis Date   Ankle swelling    Hypertension    Wears dentures    full upper, partial lower (doesn't wear lower)   Past Surgical History:  Procedure Laterality Date   ABDOMINAL HYSTERECTOMY     COLONOSCOPY WITH PROPOFOL N/A 01/18/2019   Procedure: COLONOSCOPY WITH PROPOFOL;  Surgeon: Pasty Spillers, MD;  Location: [facility];  Service: Endoscopy;  Laterality: N/A;   POLYPECTOMY  01/18/2019   Procedure: POLYPECTOMY;  Surgeon: Pasty Spillers, MD;  Location: [facility];  Service: Endoscopy;;   TUBAL LIGATION     Social History   Socioeconomic  History   Marital status: Single    Spouse name: Not on file   Number of children: 3   Years of education: GED   Highest education level: Not on file  Occupational History   Occupation: unemployed (previously worked as temp)  Tobacco Use   Smoking status: Never   Smokeless tobacco: Never  Vaping Use   Vaping status: Never Used  Substance and Sexual Activity   Alcohol use: Yes    Alcohol/week: 2.0 standard drinks of alcohol    Types: 2 Cans of beer per week    Comment: occasional, planning to quit completely   Drug use: No   Sexual activity: Not Currently    Partners: Male  Other Topics Concern   Not on file  Social History Narrative   Has family that lives close by   Social Drivers of Health   Financial Resource  Strain: Low Risk  (02/18/2023)   Received from [facility]   Overall Financial Resource Strain (CARDIA)    Difficulty of Paying Living Expenses: Not hard at all  Food Insecurity: No Food Insecurity (02/18/2023)   Received from [facility]   Hunger Vital Sign    Worried About Running Out of Food in the Last Year: Never true    Ran Out of Food in the Last Year: Never true  Transportation Needs: No Transportation Needs (02/18/2023)   Received from [facility] - Transportation    In the past 12 months, has lack of transportation kept you from medical appointments or from getting medications?: No    Lack of Transportation (Non-Medical): No  Physical Activity: Sufficiently Active (08/15/2022)   Exercise Vital Sign    Days of Exercise per Week: 7 days    Minutes of Exercise per Session: 30 min  Stress: No Stress Concern Present (08/15/2022)   [employer] of Occupational Health - Occupational Stress Questionnaire    Feeling of Stress : Only a little  Social Connections: Moderately Isolated (08/15/2022)   Social Connection and Isolation Panel [NHANES]    Frequency of Communication with Friends and Family: More than three times a week    Frequency of Social Gatherings with Friends and Family: More than three times a week    Attends Religious Services: More than 4 times per year    Active Member of [employer] or Organizations: No    Attends Banker Meetings: Never    Marital Status: Never married  Intimate Partner Violence: Not At Risk (08/15/2022)   Humiliation, Afraid, Rape, and Kick questionnaire    Fear of Current or Ex-Partner: No    Emotionally Abused: No    Physically Abused: No    Sexually Abused: No   Family History  Problem Relation Age of Onset   Diabetes Mother    Hypertension Mother    Colon polyps Father    Heart disease Father    Heart attack Father    Cancer Brother    Hypertension Son     Breast cancer Neg Hx    Colon cancer Neg Hx    Current Outpatient Medications on File Prior to Visit  Medication Sig   aspirin 81 MG tablet Take 81 mg by mouth daily.   fluticasone (FLONASE) 50 MCG/ACT nasal spray Place 2 sprays into both nostrils daily. Use for 4-6 weeks then stop and use seasonally or as needed.   lisinopril-hydrochlorothiazide (ZESTORETIC) 20-25 MG tablet Take 1 tablet by mouth daily.  Rimegepant Sulfate (NURTEC) 75 MG TBDP Take 1 tablet (75 mg total) by mouth as directed.   rizatriptan (MAXALT-MLT) 10 MG disintegrating tablet Take 1 tablet (10 mg total) by mouth as needed for migraine. May repeat in 2 hours if needed   [facility] injection Inject 0.5 mL into muscle for shingles vaccine. Repeat dose in 2-6 months.   No current facility-administered medications on file prior to visit.    Review of Systems  Constitutional:  Negative for activity change, appetite change, chills, diaphoresis, fatigue and fever.  HENT:  Negative for congestion and hearing loss.   Eyes:  Negative for visual disturbance.  Respiratory:  Negative for cough, chest tightness, shortness of breath and wheezing.   Cardiovascular:  Negative for chest pain, palpitations and leg swelling.  Gastrointestinal:  Negative for abdominal pain, constipation, diarrhea, nausea and vomiting.  Genitourinary:  Negative for dysuria, frequency and hematuria.  Musculoskeletal:  Negative for arthralgias and neck pain.  Skin:  Negative for rash.  Neurological:  Negative for dizziness, weakness, light-headedness, numbness and headaches.  Hematological:  Negative for adenopathy.  Psychiatric/Behavioral:  Negative for behavioral problems, dysphoric mood and sleep disturbance.    Per HPI unless specifically indicated above     Objective:    BP 120/78   Pulse 82   Ht 5' (1.524 m)   Wt 171 lb (77.6 kg)   SpO2 98%   BMI 33.40 kg/m   Wt Readings from Last 3 Encounters:  05/21/23 171 lb (77.6 kg)  08/15/22 174 lb  (78.9 kg)  07/21/22 174 lb (78.9 kg)    Physical Exam Vitals and nursing note reviewed.  Constitutional:      General: She is not in acute distress.    Appearance: She is well-developed. She is not diaphoretic.     Comments: Well-appearing, comfortable, cooperative  HENT:     Head: Normocephalic and atraumatic.  Eyes:     General:        Right eye: No discharge.        Left eye: No discharge.     Conjunctiva/sclera: Conjunctivae normal.     Pupils: Pupils are equal, round, and reactive to light.  Neck:     Thyroid: No thyromegaly.     Vascular: No carotid bruit.  Cardiovascular:     Rate and Rhythm: Normal rate and regular rhythm.     Pulses: Normal pulses.     Heart sounds: Normal heart sounds. No murmur heard. Pulmonary:     Effort: Pulmonary effort is normal. No respiratory distress.     Breath sounds: Normal breath sounds. No wheezing or rales.  Abdominal:     General: Bowel sounds are normal. There is no distension.     Palpations: Abdomen is soft. There is no mass.     Tenderness: There is no abdominal tenderness.  Musculoskeletal:        General: No tenderness. Normal range of motion.     Cervical back: Normal range of motion and neck supple.     Right lower leg: No edema.     Left lower leg: No edema.     Comments: Upper / Lower Extremities: - Normal muscle tone, strength bilateral upper extremities 5/5, lower extremities 5/5  Lymphadenopathy:     Cervical: No cervical adenopathy.  Skin:    General: Skin is warm and dry.     Findings: No erythema or rash.  Neurological:     Mental Status: She is alert and oriented to person, place, and  time.     Comments: Distal sensation intact to light touch all extremities  Psychiatric:        Mood and Affect: Mood normal.        Behavior: Behavior normal.        Thought Content: Thought content normal.     Comments: Well groomed, good eye contact, normal speech and thoughts     Results for orders placed or performed  in visit on 07/22/22  TSH   Collection Time: 07/22/22 11:01 AM  Result Value Ref Range   TSH 1.01 0.40 - 4.50 mIU/L  Lipid panel   Collection Time: 07/22/22 11:01 AM  Result Value Ref Range   Cholesterol 208 (H) <200 mg/dL   HDL 48 (L) > OR = 50 mg/dL   Triglycerides 960 <454 mg/dL   LDL Cholesterol (Calc) 134 (H) mg/dL (calc)   Total CHOL/HDL Ratio 4.3 <5.0 (calc)   Non-HDL Cholesterol (Calc) 160 (H) <130 mg/dL (calc)  Hemoglobin U9W   Collection Time: 07/22/22 11:01 AM  Result Value Ref Range   Hgb A1c MFr Bld 5.9 (H) <5.7 % of total Hgb   Mean Plasma Glucose 123 mg/dL   eAG (mmol/L) 6.8 mmol/L  CBC with Differential/Platelet   Collection Time: 07/22/22 11:01 AM  Result Value Ref Range   WBC 5.4 3.8 - 10.8 Thousand/uL   RBC 4.72 3.80 - 5.10 Million/uL   Hemoglobin 13.1 11.7 - 15.5 g/dL   HCT 11.9 14.7 - 82.9 %   MCV 84.5 80.0 - 100.0 fL   MCH 27.8 27.0 - 33.0 pg   MCHC 32.8 32.0 - 36.0 g/dL   RDW 56.2 13.0 - 86.5 %   Platelets 208 140 - 400 Thousand/uL   MPV 11.1 7.5 - 12.5 fL   Neutro Abs 2,344 1,500 - 7,800 cells/uL   Lymphs Abs 2,511 850 - 3,900 cells/uL   Absolute Monocytes 443 200 - 950 cells/uL   Eosinophils Absolute 70 15 - 500 cells/uL   Basophils Absolute 32 0 - 200 cells/uL   Neutrophils Relative % 43.4 %   Total Lymphocyte 46.5 %   Monocytes Relative 8.2 %   Eosinophils Relative 1.3 %   Basophils Relative 0.6 %  COMPLETE METABOLIC PANEL WITH GFR   Collection Time: 07/22/22 11:01 AM  Result Value Ref Range   Glucose, Bld 92 65 - 99 mg/dL   BUN 10 7 - 25 mg/dL   Creat 7.84 6.96 - 2.95 mg/dL   eGFR 76 > OR = 60 MW/UXL/2.44W1   BUN/Creatinine Ratio SEE NOTE: 6 - 22 (calc)   Sodium 141 135 - 146 mmol/L   Potassium 3.6 3.5 - 5.3 mmol/L   Chloride 104 98 - 110 mmol/L   CO2 28 20 - 32 mmol/L   Calcium 9.5 8.6 - 10.4 mg/dL   Total Protein 6.7 6.1 - 8.1 g/dL   Albumin 4.3 3.6 - 5.1 g/dL   Globulin 2.4 1.9 - 3.7 g/dL (calc)   AG Ratio 1.8 1.0 - 2.5 (calc)    Total Bilirubin 0.8 0.2 - 1.2 mg/dL   Alkaline phosphatase (APISO) 46 37 - 153 U/L   AST 19 10 - 35 U/L   ALT 18 6 - 29 U/L      Assessment & Plan:   Problem List Items Addressed This Visit     Dyslipidemia (high LDL; low HDL)   Relevant Orders   TSH   Lipid panel   Essential hypertension   Relevant Orders   COMPLETE METABOLIC  PANEL WITH GFR   CBC with Differential/Platelet   Obesity (BMI 30.0-34.9)   Relevant Orders   Lipid panel   Hemoglobin A1c   COMPLETE METABOLIC PANEL WITH GFR   Other Visit Diagnoses       Annual physical exam    -  Primary   Relevant Orders   TSH   Lipid panel   Hemoglobin A1c   COMPLETE METABOLIC PANEL WITH GFR   CBC with Differential/Platelet     Postmenopausal estrogen deficiency       Relevant Orders   DG Bone Density     Need for Streptococcus pneumoniae vaccination       Relevant Orders   Pneumococcal conjugate vaccine 20-valent (Completed)     Elevated hemoglobin A1c       Relevant Orders   Hemoglobin A1c        Updated Health Maintenance information Fasting lab tomorrow Encouraged improvement to lifestyle with diet and exercise Goal of weight loss  General Health Maintenance Annual check-up. Stable weight. Blood pressure within normal limits. Recent illness with cold symptoms resolved. -Order comprehensive blood panel for 05/22/2023 at 11:00 AM. -Recommend Shingles vaccine at pharmacy. -Order bone density test at breast center. -Administer Pneumonia vaccine today. Prevnar-20 -Plan for colonoscopy in October/November 2025.  Well-controlled HTN  No known complications  Plan:  1. Continue current BP regimen - Lisinopril-hydrochlorothiazide 20-25mg  daily 2. Encourage improved lifestyle - low sodium diet, regular exercise 3. Continue monitor BP outside office, bring readings to next visit, if persistently >140/90 or new symptoms notify office sooner   Grief and Bereavement Loss of son last year. Reports emotional ups and  downs. Has support from family, friends, and church members. -Encourage continued use of support network.  Medication Management No current need for medication renewal. -Continue current medications as prescribed.  Follow-up Patient to call for next appointment.         Orders Placed This Encounter  Procedures   DG Bone Density    Standing Status:   Future    Expiration Date:   05/20/2024    Reason for Exam (SYMPTOM  OR DIAGNOSIS REQUIRED):   postmenopausal estrogen deficiency    Preferred imaging location?:   [facility]   Pneumococcal conjugate vaccine 20-valent   TSH    Standing Status:   Future    Expected Date:   05/22/2023    Expiration Date:   05/20/2024   Lipid panel    Standing Status:   Future    Expected Date:   05/22/2023    Expiration Date:   05/20/2024    Has the patient fasted?:   Yes   Hemoglobin A1c    Standing Status:   Future    Expected Date:   05/22/2023    Expiration Date:   05/20/2024   COMPLETE METABOLIC PANEL WITH GFR    Standing Status:   Future    Expected Date:   05/22/2023    Expiration Date:   05/20/2024   CBC with Differential/Platelet    Standing Status:   Future    Expected Date:   05/22/2023    Expiration Date:   05/20/2024    No orders of the defined types were placed in this encounter.    Follow up plan: Return in about 1 year (around 05/20/2024).  Future labs tomorrow 2/28 CMET A1c CBC Lipid TSH  Saralyn Pilar, DO [facility] 05/21/2023, 11:16 AM

## 2023-05-21 NOTE — Patient Instructions (Addendum)
 Thank you for coming to the office today.  Labs tomorrow  Contact us for refills  Pneumonia shot today  Shingles at the pharmacy  -----------  For DEXA Scan (Bone mineral density) screening for osteoporosis  Call the Imaging Center below anytime to schedule your own appointment now that order has been placed.  [facility] [address] [place], [place] [address] Phone: [phone]  -----------------------------------  Colonoscopy October November 2025  [facility]) [address] - [address] [place], [place] [address] Phone: [phone]   DUE for FASTING BLOOD WORK (no food or drink after midnight before the lab appointment, only water or coffee without cream/sugar on the morning of)  SCHEDULE "Lab Only" visit in the morning at the clinic for lab draw in 1 day  - Make sure Lab Only appointment is at about 1 week before your next appointment, so that results will be available  For Lab Results, once available within 2-3 days of blood draw, you can can log in to MyChart online to view your results and a brief explanation. Also, we can discuss results at next follow-up visit.     Please schedule a Follow-up Appointment to: Return in about 1 year (around 05/20/2024).  If you have any other questions or concerns, please feel free to call the office or send a message through MyChart. You may also schedule an earlier appointment if necessary.  Additionally, you may be receiving a survey about your experience at our office within a few days to 1 week by e-mail or mail. We value your feedback.  Saralyn Pilar, DO [facility], [place]

## 2023-05-22 ENCOUNTER — Other Ambulatory Visit: Payer: 59

## 2023-05-22 DIAGNOSIS — E785 Hyperlipidemia, unspecified: Secondary | ICD-10-CM

## 2023-05-22 DIAGNOSIS — I10 Essential (primary) hypertension: Secondary | ICD-10-CM

## 2023-05-22 DIAGNOSIS — E66811 Obesity, class 1: Secondary | ICD-10-CM

## 2023-05-22 DIAGNOSIS — R7309 Other abnormal glucose: Secondary | ICD-10-CM | POA: Diagnosis not present

## 2023-05-22 DIAGNOSIS — Z0000 Encounter for general adult medical examination without abnormal findings: Secondary | ICD-10-CM

## 2023-05-23 LAB — CBC WITH DIFFERENTIAL/PLATELET
Absolute Lymphocytes: 2655 {cells}/uL (ref 850–3900)
Absolute Monocytes: 568 {cells}/uL (ref 200–950)
Basophils Absolute: 28 {cells}/uL (ref 0–200)
Basophils Relative: 0.4 %
Eosinophils Absolute: 78 {cells}/uL (ref 15–500)
Eosinophils Relative: 1.1 %
HCT: 39.9 % (ref 35.0–45.0)
Hemoglobin: 12.7 g/dL (ref 11.7–15.5)
MCH: 27.5 pg (ref 27.0–33.0)
MCHC: 31.8 g/dL — ABNORMAL LOW (ref 32.0–36.0)
MCV: 86.6 fL (ref 80.0–100.0)
MPV: 10.8 fL (ref 7.5–12.5)
Monocytes Relative: 8 %
Neutro Abs: 3770 {cells}/uL (ref 1500–7800)
Neutrophils Relative %: 53.1 %
Platelets: 208 10*3/uL (ref 140–400)
RBC: 4.61 10*6/uL (ref 3.80–5.10)
RDW: 12.6 % (ref 11.0–15.0)
Total Lymphocyte: 37.4 %
WBC: 7.1 10*3/uL (ref 3.8–10.8)

## 2023-05-23 LAB — COMPLETE METABOLIC PANEL WITH GFR
AG Ratio: 1.8 (calc) (ref 1.0–2.5)
ALT: 14 U/L (ref 6–29)
AST: 18 U/L (ref 10–35)
Albumin: 4.3 g/dL (ref 3.6–5.1)
Alkaline phosphatase (APISO): 44 U/L (ref 37–153)
BUN: 12 mg/dL (ref 7–25)
CO2: 31 mmol/L (ref 20–32)
Calcium: 9.4 mg/dL (ref 8.6–10.4)
Chloride: 103 mmol/L (ref 98–110)
Creat: 0.75 mg/dL (ref 0.60–1.00)
Globulin: 2.4 g/dL (ref 1.9–3.7)
Glucose, Bld: 77 mg/dL (ref 65–99)
Potassium: 3.8 mmol/L (ref 3.5–5.3)
Sodium: 141 mmol/L (ref 135–146)
Total Bilirubin: 0.9 mg/dL (ref 0.2–1.2)
Total Protein: 6.7 g/dL (ref 6.1–8.1)
eGFR: 86 mL/min/{1.73_m2} (ref >=60)

## 2023-05-23 LAB — TSH: TSH: 0.79 m[IU]/L (ref 0.40–4.50)

## 2023-05-23 LAB — LIPID PANEL
Cholesterol: 197 mg/dL (ref <200)
HDL: 46 mg/dL — ABNORMAL LOW (ref >=50)
LDL Cholesterol (Calc): 125 mg/dL — ABNORMAL HIGH
Non-HDL Cholesterol (Calc): 151 mg/dL — ABNORMAL HIGH (ref <130)
Total CHOL/HDL Ratio: 4.3 (calc) (ref <5.0)
Triglycerides: 138 mg/dL (ref <150)

## 2023-05-23 LAB — HEMOGLOBIN A1C
Hgb A1c MFr Bld: 5.7 %{Hb} — ABNORMAL HIGH (ref <5.7)
Mean Plasma Glucose: 117 mg/dL
eAG (mmol/L): 6.5 mmol/L

## 2023-07-07 DIAGNOSIS — M2011 Hallux valgus (acquired), right foot: Secondary | ICD-10-CM | POA: Diagnosis not present

## 2023-07-07 DIAGNOSIS — M2012 Hallux valgus (acquired), left foot: Secondary | ICD-10-CM | POA: Diagnosis not present

## 2023-07-07 DIAGNOSIS — M79675 Pain in left toe(s): Secondary | ICD-10-CM | POA: Diagnosis not present

## 2023-07-07 DIAGNOSIS — M79674 Pain in right toe(s): Secondary | ICD-10-CM | POA: Diagnosis not present

## 2023-07-07 DIAGNOSIS — B351 Tinea unguium: Secondary | ICD-10-CM | POA: Diagnosis not present

## 2023-07-07 DIAGNOSIS — M2042 Other hammer toe(s) (acquired), left foot: Secondary | ICD-10-CM | POA: Diagnosis not present

## 2023-08-18 ENCOUNTER — Telehealth: Payer: Self-pay

## 2023-08-18 NOTE — Telephone Encounter (Signed)
 The patient called in to schedule her colonoscopy.

## 2023-08-18 NOTE — Telephone Encounter (Signed)
 Spoken to patient and I have inform her that she is not due until 01/18/2024. Asked if patient would like to go ahead and schedule or wait a few more months. Patient decied she will wait closer to December, placed a reminder to call patient at the end of August.

## 2023-08-21 ENCOUNTER — Ambulatory Visit: Payer: 59

## 2023-08-21 DIAGNOSIS — Z0000 Encounter for general adult medical examination without abnormal findings: Secondary | ICD-10-CM | POA: Diagnosis not present

## 2023-08-21 NOTE — Progress Notes (Signed)
 Subjective:   Brittney Schmidt is a 73 y.o. who presents for a Medicare Wellness preventive visit.  As a reminder, Annual Wellness Visits don't include a physical exam, and some assessments may be limited, especially if this visit is performed virtually. We may recommend an in-person follow-up visit with your provider if needed.  Visit Complete: Virtual I connected with  Hiram Lukes on 08/21/23 by a audio enabled telemedicine application and verified that I am speaking with the correct person using two identifiers.  Patient Location: Home  Provider Location: Home Office  I discussed the limitations of evaluation and management by telemedicine. The patient expressed understanding and agreed to proceed.  Vital Signs: Because this visit was a virtual/telehealth visit, some criteria may be missing or patient reported. Any vitals not documented were not able to be obtained and vitals that have been documented are patient reported.  VideoDeclined- This patient declined Librarian, academic. Therefore the visit was completed with audio only.  Persons Participating in Visit: Patient.  AWV Questionnaire: No: Patient Medicare AWV questionnaire was not completed prior to this visit.  Cardiac Risk Factors include: advanced age (>7men, >40 women);dyslipidemia;hypertension;obesity (BMI >30kg/m2)     Objective:     Today's Vitals   08/21/23 1011  PainSc: 4    There is no height or weight on file to calculate BMI.     08/21/2023   10:16 AM 08/15/2022   10:36 AM 08/20/2021   12:03 PM 07/05/2021   10:38 AM 06/19/2020   11:43 AM 01/18/2019    9:42 AM 11/16/2018   10:26 AM  Advanced Directives  Does Patient Have a Medical Advance Directive? No No No No No No No  Would patient like information on creating a medical advance directive? No - Patient declined No - Patient declined  No - Patient declined  No - Patient declined     Current Medications  (verified) Outpatient Encounter Medications as of 08/21/2023  Medication Sig   aspirin 81 MG tablet Take 81 mg by mouth daily.   fluticasone  (FLONASE ) 50 MCG/ACT nasal spray Place 2 sprays into both nostrils daily. Use for 4-6 weeks then stop and use seasonally or as needed.   lisinopril -hydrochlorothiazide  (ZESTORETIC ) 20-25 MG tablet Take 1 tablet by mouth daily.   Rimegepant Sulfate (NURTEC) 75 MG TBDP Take 1 tablet (75 mg total) by mouth as directed. (Patient not taking: Reported on 08/21/2023)   rizatriptan  (MAXALT -MLT) 10 MG disintegrating tablet Take 1 tablet (10 mg total) by mouth as needed for migraine. May repeat in 2 hours if needed (Patient not taking: Reported on 08/21/2023)   [DISCONTINUED] SHINGRIX  injection Inject 0.5 mL into muscle for shingles vaccine. Repeat dose in 2-6 months.   No facility-administered encounter medications on file as of 08/21/2023.    Allergies (verified) Patient has no known allergies.   History: Past Medical History:  Diagnosis Date   Ankle swelling    Hypertension    Wears dentures    full upper, partial lower (doesn't wear lower)   Past Surgical History:  Procedure Laterality Date   ABDOMINAL HYSTERECTOMY     COLONOSCOPY WITH PROPOFOL  N/A 01/18/2019   Procedure: COLONOSCOPY WITH PROPOFOL ;  Surgeon: Irby Mannan, MD;  Location: [facility];  Service: Endoscopy;  Laterality: N/A;   POLYPECTOMY  01/18/2019   Procedure: POLYPECTOMY;  Surgeon: Irby Mannan, MD;  Location: [facility];  Service: Endoscopy;;   TUBAL LIGATION     Family History  Problem  Relation Age of Onset   Diabetes Mother    Hypertension Mother    Colon polyps Father    Heart disease Father    Heart attack Father    Cancer Brother    Hypertension Son    Breast cancer Neg Hx    Colon cancer Neg Hx    Social History   Socioeconomic History   Marital status: Single    Spouse name: Not on file   Number of children: 3   Years of  education: GED   Highest education level: Not on file  Occupational History   Occupation: unemployed (previously worked as temp)  Tobacco Use   Smoking status: Never   Smokeless tobacco: Never  Vaping Use   Vaping status: Never Used  Substance and Sexual Activity   Alcohol use: Yes    Alcohol/week: 2.0 standard drinks of alcohol    Types: 2 Cans of beer per week    Comment: occasional, planning to quit completely   Drug use: No   Sexual activity: Not Currently    Partners: Male  Other Topics Concern   Not on file  Social History Narrative   Has family that lives close by   Social Drivers of Health   Financial Resource Strain: Low Risk  (08/21/2023)   Overall Financial Resource Strain (CARDIA)    Difficulty of Paying Living Expenses: Not hard at all  Food Insecurity: No Food Insecurity (08/21/2023)   Hunger Vital Sign    Worried About Running Out of Food in the Last Year: Never true    Ran Out of Food in the Last Year: Never true  Transportation Needs: No Transportation Needs (08/21/2023)   PRAPARE - Administrator, Civil Service (Medical): No    Lack of Transportation (Non-Medical): No  Physical Activity: Sufficiently Active (08/21/2023)   Exercise Vital Sign    Days of Exercise per Week: 7 days    Minutes of Exercise per Session: 30 min  Stress: No Stress Concern Present (08/21/2023)   [employer] of Occupational Health - Occupational Stress Questionnaire    Feeling of Stress : Not at all  Social Connections: Moderately Isolated (08/21/2023)   Social Connection and Isolation Panel [NHANES]    Frequency of Communication with Friends and Family: More than three times a week    Frequency of Social Gatherings with Friends and Family: More than three times a week    Attends Religious Services: More than 4 times per year    Active Member of [employer] or Organizations: No    Attends Engineer, structural: Never    Marital Status: Never married    Tobacco  Counseling Counseling given: Not Answered    Clinical Intake:  Pre-visit preparation completed: Yes  Pain : 0-10 Pain Score: 4  Pain Type: Chronic pain Pain Location: Back Pain Orientation: Lower Pain Descriptors / Indicators: Aching, Discomfort Pain Onset: 1 to 4 weeks ago Pain Frequency: Intermittent Pain Relieving Factors: TYLENOL  Pain Relieving Factors: TYLENOL  BMI - recorded: 33.4 Nutritional Status: BMI > 30  Obese Nutritional Risks: None Diabetes: No  Lab Results  Component Value Date   HGBA1C 5.7 (H) 05/22/2023   HGBA1C 5.9 (H) 07/22/2022   HGBA1C 5.4 04/05/2021     How often do you need to have someone help you when you read instructions, pamphlets, or other written materials from your doctor or pharmacy?: 1 - Never  Interpreter Needed?: No  Information entered by :: Dellie Fergusson, LPN  Activities of Daily Living    08/21/2023   10:17 AM  In your present state of health, do you have any difficulty performing the following activities:  Hearing? 0  Vision? 0  Difficulty concentrating or making decisions? 0  Walking or climbing stairs? 0  Dressing or bathing? 0  Doing errands, shopping? 0  Preparing Food and eating ? N  Using the Toilet? N  In the past six months, have you accidently leaked urine? N  Do you have problems with loss of bowel control? N  Managing your Medications? N  Managing your Finances? N  Housekeeping or managing your Housekeeping? N    Patient Care Team: Raina Bunting, DO as PCP - General (Family Medicine) Burnie Cartwright, RN as Registered Nurse Arlette Benders, RN (Inactive) as Registered Nurse Pa, [facility]  Indicate any recent Medical Services you may have received from other than [facility] providers in the past year (date may be approximate).     Assessment:    This is a routine wellness examination for Breckin.  Hearing/Vision screen Hearing Screening - Comments:: NO AIDS Vision Screening -  Comments:: WEARS GLASSES ALL DAY- [facility] VISION   Goals Addressed             This Visit's Progress    DIET - REDUCE SUGAR INTAKE         Depression Screen     08/21/2023   10:15 AM 05/21/2023   12:47 PM 08/15/2022   10:35 AM 07/21/2022   11:48 AM 10/07/2021    9:52 AM 07/05/2021   10:35 AM 06/19/2020   11:45 AM  PHQ 2/9 Scores  PHQ - 2 Score 1 0 1 0 0 0 0  PHQ- 9 Score 1  1  0      Fall Risk     08/21/2023   10:17 AM 05/21/2023   11:32 AM 08/15/2022   10:37 AM 07/21/2022   11:48 AM 10/07/2021    9:52 AM  Fall Risk   Falls in the past year? 0 0 0 0 0  Number falls in past yr: 0  0  0  Injury with Fall? 0  0  0  Risk for fall due to : No Fall Risks  No Fall Risks  No Fall Risks  Follow up Falls evaluation completed  Falls prevention discussed;Falls evaluation completed  Falls evaluation completed    MEDICARE RISK AT HOME:  Medicare Risk at Home Any stairs in or around the home?: Yes If so, are there any without handrails?: No Home free of loose throw rugs in walkways, pet beds, electrical cords, etc?: Yes Adequate lighting in your home to reduce risk of falls?: Yes Life alert?: No Use of a cane, walker or w/c?: No Grab bars in the bathroom?: No Shower chair or bench in shower?: No Elevated toilet seat or a handicapped toilet?: No  TIMED UP AND GO:  Was the test performed?  No  Cognitive Function: 6CIT completed    10/28/2017    3:37 PM  MMSE - Mini Mental State Exam  Orientation to time 5  Orientation to Place 5  Registration 3  Attention/ Calculation 5  Recall 1  Language- name 2 objects 2  Language- repeat 1  Language- follow 3 step command 3  Language- read & follow direction 1  Write a sentence 1  Copy design 1  Total score 28        08/21/2023   10:18 AM 08/15/2022  10:41 AM 06/19/2020   11:48 AM 11/16/2018   10:31 AM  6CIT Screen  What Year? 0 points 0 points 0 points 0 points  What month? 0 points 0 points 0 points 0 points  What time? 0  points 0 points 0 points 0 points  Count back from 20 0 points 0 points 0 points 0 points  Months in reverse 2 points 0 points 2 points 0 points  Repeat phrase 6 points 4 points 6 points 0 points  Total Score 8 points 4 points 8 points 0 points    Immunizations Immunization History  Administered Date(s) Administered   Fluad Quad(high Dose 65+) 12/07/2018, 01/05/2020, 03/03/2022   Fluad Trivalent(High Dose 65+) 01/07/2023   Influenza-Unspecified 04/14/2018   PFIZER(Purple Top)SARS-COV-2 Vaccination 06/16/2019, 07/12/2019, 09/11/2020   PNEUMOCOCCAL CONJUGATE-20 05/21/2023   Pneumococcal Conjugate-13 10/28/2017   Pneumococcal Polysaccharide-23 11/16/2018   Tdap 10/17/2015    Screening Tests Health Maintenance  Topic Date Due   Zoster Vaccines- Shingrix  (1 of 2) Never done   DEXA SCAN  Never done   COVID-19 Vaccine (4 - 2024-25 season) 11/23/2022   INFLUENZA VACCINE  10/23/2023   Colonoscopy  01/18/2024   MAMMOGRAM  03/02/2024   Medicare Annual Wellness (AWV)  08/20/2024   DTaP/Tdap/Td (2 - Td or Tdap) 10/16/2025   Pneumonia Vaccine 30+ Years old  Completed   Hepatitis C Screening  Completed   HPV VACCINES  Aged Out   Meningococcal B Vaccine  Aged Out    Health Maintenance  Health Maintenance Due  Topic Date Due   Zoster Vaccines- Shingrix  (1 of 2) Never done   DEXA SCAN  Never done   COVID-19 Vaccine (4 - 2024-25 season) 11/23/2022   Health Maintenance Items Addressed: UP TO DATE ON SHOTS EXCEPT SHINGRIX ; UP TO DATE ON MAMMOGRAM & COLONOSCOPY; BDS ORDERED  Additional Screening:  Vision Screening: Recommended annual ophthalmology exams for early detection of glaucoma and other disorders of the eye.  Dental Screening: Recommended annual dental exams for proper oral hygiene  Community Resource Referral / Chronic Care Management: CRR required this visit?  No   CCM required this visit?  No   Plan:    I have personally reviewed and noted the following in the  patient's chart:   Medical and social history Use of alcohol, tobacco or illicit drugs  Current medications and supplements including opioid prescriptions. Patient is not currently taking opioid prescriptions. Functional ability and status Nutritional status Physical activity Advanced directives List of other physicians Hospitalizations, surgeries, and ER visits in previous 12 months Vitals Screenings to include cognitive, depression, and falls Referrals and appointments  In addition, I have reviewed and discussed with patient certain preventive protocols, quality metrics, and best practice recommendations. A written personalized care plan for preventive services as well as general preventive health recommendations were provided to patient.   Pinky Bright, LPN   11/20/5619   After Visit Summary: (MyChart) Due to this being a telephonic visit, the after visit summary with patients personalized plan was offered to patient via MyChart   Notes: Nothing significant to report at this time.

## 2023-08-21 NOTE — Patient Instructions (Signed)
 Ms. Caffee , Thank you for taking time out of your busy schedule to complete your Annual Wellness Visit with me. I enjoyed our conversation and look forward to speaking with you again next year. I, as well as your care team,  appreciate your ongoing commitment to your health goals. Please review the following plan we discussed and let me know if I can assist you in the future.   Follow up Visits: Next Medicare AWV with our clinical staff:   08/26/24 @ 9:30 AM BY PHONE Have you seen your provider in the last 6 months (3 months if uncontrolled diabetes)? Yes   Clinician Recommendations:  Aim for 30 minutes of exercise or brisk walking, 6-8 glasses of water , and 5 servings of fruits and vegetables each day. TAKE CARE!      This is a list of the screening recommended for you and due dates:  Health Maintenance  Topic Date Due   Zoster (Shingles) Vaccine (1 of 2) Never done   DEXA scan (bone density measurement)  Never done   COVID-19 Vaccine (4 - 2024-25 season) 11/23/2022   Flu Shot  10/23/2023   Colon Cancer Screening  01/18/2024   Mammogram  03/02/2024   Medicare Annual Wellness Visit  08/20/2024   DTaP/Tdap/Td vaccine (2 - Td or Tdap) 10/16/2025   Pneumonia Vaccine  Completed   Hepatitis C Screening  Completed   HPV Vaccine  Aged Out   Meningitis B Vaccine  Aged Out    Advanced directives: (ACP Link)Information on Advanced Care Planning can be found at [place]  [facility] Advance Health Care Directives Advance Health Care Directives. [email or web address]  Advance Care Planning is important because it:  [x]  Makes sure you receive the medical care that is consistent with your values, goals, and preferences  [x]  It provides guidance to your family and loved ones and reduces their decisional burden about whether or not they are making the right decisions based on your wishes.  Follow the link provided in your after visit summary or read over the paperwork we have mailed to you to help  you started getting your Advance Directives in place. If you need assistance in completing these, please reach out to us  so that we can help you!

## 2023-08-27 ENCOUNTER — Encounter: Payer: Self-pay | Admitting: Family Medicine

## 2023-08-27 ENCOUNTER — Ambulatory Visit (INDEPENDENT_AMBULATORY_CARE_PROVIDER_SITE_OTHER): Admitting: Family Medicine

## 2023-08-27 VITALS — BP 124/76 | HR 76 | Ht 60.0 in | Wt 171.0 lb

## 2023-08-27 DIAGNOSIS — M545 Low back pain, unspecified: Secondary | ICD-10-CM

## 2023-08-27 DIAGNOSIS — M62830 Muscle spasm of back: Secondary | ICD-10-CM | POA: Diagnosis not present

## 2023-08-27 DIAGNOSIS — G8929 Other chronic pain: Secondary | ICD-10-CM

## 2023-08-27 MED ORDER — BACLOFEN 10 MG PO TABS: 5.0000 mg | ORAL_TABLET | Freq: Three times a day (TID) | ORAL | 2 refills | Status: AC | PRN

## 2023-08-27 NOTE — Patient Instructions (Addendum)
 Thank you for coming to the office today.  X-ray here on Wednesday next week. 09/02/23. Any time walk in.  Recommend to start taking Tylenol Extra Strength 500mg  tabs - take 1 to 2 tabs per dose (max 1000mg ) every 6-8 hours for pain (take regularly, don't skip a dose for next 7 days), max 24 hour daily dose is 6 tablets or 3000mg . In the future you can repeat the same everyday Tylenol course for 1-2 weeks at a time.  - This is safe to take with anti-inflammatory medicines (Ibuprofen, Advil, Naproxen, Aleve, Meloxicam , Mobic )  Start taking Baclofen (Lioresal) 10mg  (muscle relaxant) - start with half (cut) to one whole pill at night as needed for next 1-3 nights (may make you drowsy, caution with driving) see how it affects you, then if tolerated increase to one pill 2 to 3 times a day or (every 8 hours as needed)  Add Ibuprofen or Advil 200mg  take 1-3 per dose as needed, up to 3 times a day with meal.  Leg cramps - Try spoonful of yellow mustard to relieve leg cramps or try daily to prevent the problem  - OTC natural option is Hyland's Leg Cramps (Dissolving tablet) take as needed for muscle cramps  Please schedule a Follow-up Appointment to: Return if symptoms worsen or fail to improve.  If you have any other questions or concerns, please feel free to call the office or send a message through MyChart. You may also schedule an earlier appointment if necessary.  Additionally, you may be receiving a survey about your experience at our office within a few days to 1 week by e-mail or mail. We value your feedback.  Domingo Friend, DO [facility], [place]

## 2023-08-27 NOTE — Progress Notes (Signed)
 Subjective:    Patient ID: Brittney Schmidt, female    DOB: 1951/01/09, 73 y.o.   MRN: [id number]  Brittney Schmidt is a 73 y.o. female presenting on 08/27/2023 for Back Pain  Patient presents for a same day appointment.   HPI  Discussed the use of AI scribe software for clinical note transcription with the patient, who gave verbal consent to proceed.  History of Present Illness   [name] is a 73 year old female who presents with intermittent lower back pain.  She experiences intermittent pain in her lower back, specifically on the left side, which is most noticeable in the morning or after prolonged periods in one position. The pain is described as coming and going, not present every day, and has been occurring on and off for a few weeks.   The pain is sometimes accompanied by muscle spasms, particularly when getting out of bed. She recalls a specific instance of a leg spasm upon waking in the middle of the night. No sharp stabbing or radiating pains down the legs, except for the noted leg spasm. She uses mustard as a remedy for leg cramps experienced at night.  She has not had an x-ray recently and notes that getting out of her daughter's car, which has a low backseat, can trigger pain. For management, she takes Tylenol, which provides some relief, and occasionally uses ibuprofen or Advil, although she ran out of Advil and switched to Tylenol. She has previously used muscle relaxants, specifically meloxicam  and Robaxin , for neck pain years ago.         08/21/2023   10:15 AM 05/21/2023   12:47 PM 08/15/2022   10:35 AM  Depression screen PHQ 2/9  Decreased Interest 0 0 0  Down, Depressed, Hopeless 1 0 1  PHQ - 2 Score 1 0 1  Altered sleeping 0  0  Tired, decreased energy 0  0  Change in appetite 0  0  Feeling bad or failure about yourself  0  0  Trouble concentrating 0  0  Moving slowly or fidgety/restless 0  0  Suicidal thoughts 0  0  PHQ-9 Score 1  1  Difficult doing  work/chores Not difficult at all  Not difficult at all       07/21/2022   11:48 AM 10/07/2021    9:52 AM  GAD 7 : Generalized Anxiety Score  Nervous, Anxious, on Edge 0 0  Control/stop worrying 0 0  Worry too much - different things 0 0  Trouble relaxing 0 0  Restless 0 0  Easily annoyed or irritable 0 0  Afraid - awful might happen 0 0  Total GAD 7 Score 0 0  Anxiety Difficulty  Not difficult at all    Social History   Tobacco Use   Smoking status: Never   Smokeless tobacco: Never  Vaping Use   Vaping status: Never Used  Substance Use Topics   Alcohol use: Yes    Alcohol/week: 2.0 standard drinks of alcohol    Types: 2 Cans of beer per week    Comment: occasional, planning to quit completely   Drug use: No    Review of Systems Per HPI unless specifically indicated above     Objective:     BP 124/76 (BP Location: Right Arm, Patient Position: Sitting, Cuff Size: Normal)   Pulse 76   Ht 5' (1.524 m)   Wt 171 lb (77.6 kg)   SpO2 94%   BMI 33.40  kg/m   Wt Readings from Last 3 Encounters:  08/27/23 171 lb (77.6 kg)  05/21/23 171 lb (77.6 kg)  08/15/22 174 lb (78.9 kg)    Physical Exam Vitals and nursing note reviewed.  Constitutional:      General: She is not in acute distress.    Appearance: Normal appearance. She is well-developed. She is not diaphoretic.     Comments: Well-appearing, comfortable, cooperative  HENT:     Head: Normocephalic and atraumatic.  Eyes:     General:        Right eye: No discharge.        Left eye: No discharge.     Conjunctiva/sclera: Conjunctivae normal.  Cardiovascular:     Rate and Rhythm: Normal rate.  Pulmonary:     Effort: Pulmonary effort is normal.  Musculoskeletal:     Comments: Low Back Inspection: Normal appearance, normal body habitus, no spinal deformity, symmetrical. Palpation: No tenderness over spinous processes. Bilateral lumbar paraspinal muscles mild tender and with hypertonicity/spasm. ROM: Full active  ROM forward flex / back extension, rotation L/R without discomfort Special Testing: Seated SLR negative for radicular pain bilaterally  Strength: Bilateral hip flex/ext 5/5, knee flex/ext 5/5, ankle dorsiflex/plantarflex 5/5 Neurovascular: intact distal sensation to light touch   Skin:    General: Skin is warm and dry.     Findings: No erythema or rash.  Neurological:     Mental Status: She is alert and oriented to person, place, and time.  Psychiatric:        Mood and Affect: Mood normal.        Behavior: Behavior normal.        Thought Content: Thought content normal.     Comments: Well groomed, good eye contact, normal speech and thoughts     Results for orders placed or performed in visit on 05/22/23  CBC with Differential/Platelet   Collection Time: 05/22/23 11:03 AM  Result Value Ref Range   WBC 7.1 3.8 - 10.8 Thousand/uL   RBC 4.61 3.80 - 5.10 Million/uL   Hemoglobin 12.7 11.7 - 15.5 g/dL   HCT 16.1 09.6 - 04.5 %   MCV 86.6 80.0 - 100.0 fL   MCH 27.5 27.0 - 33.0 pg   MCHC 31.8 (L) 32.0 - 36.0 g/dL   RDW 40.9 81.1 - 91.4 %   Platelets 208 140 - 400 Thousand/uL   MPV 10.8 7.5 - 12.5 fL   Neutro Abs 3,770 1,500 - 7,800 cells/uL   Absolute Lymphocytes 2,655 850 - 3,900 cells/uL   Absolute Monocytes 568 200 - 950 cells/uL   Eosinophils Absolute 78 15 - 500 cells/uL   Basophils Absolute 28 0 - 200 cells/uL   Neutrophils Relative % 53.1 %   Total Lymphocyte 37.4 %   Monocytes Relative 8.0 %   Eosinophils Relative 1.1 %   Basophils Relative 0.4 %  COMPLETE METABOLIC PANEL WITH GFR   Collection Time: 05/22/23 11:03 AM  Result Value Ref Range   Glucose, Bld 77 65 - 99 mg/dL   BUN 12 7 - 25 mg/dL   Creat 7.82 9.56 - 2.13 mg/dL   eGFR 86 > OR = 60 YQ/MVH/8.46N6   BUN/Creatinine Ratio SEE NOTE: 6 - 22 (calc)   Sodium 141 135 - 146 mmol/L   Potassium 3.8 3.5 - 5.3 mmol/L   Chloride 103 98 - 110 mmol/L   CO2 31 20 - 32 mmol/L   Calcium 9.4 8.6 - 10.4 mg/dL   Total  Protein 6.7  6.1 - 8.1 g/dL   Albumin 4.3 3.6 - 5.1 g/dL   Globulin 2.4 1.9 - 3.7 g/dL (calc)   AG Ratio 1.8 1.0 - 2.5 (calc)   Total Bilirubin 0.9 0.2 - 1.2 mg/dL   Alkaline phosphatase (APISO) 44 37 - 153 U/L   AST 18 10 - 35 U/L   ALT 14 6 - 29 U/L  Hemoglobin A1c   Collection Time: 05/22/23 11:03 AM  Result Value Ref Range   Hgb A1c MFr Bld 5.7 (H) <5.7 % of total Hgb   Mean Plasma Glucose 117 mg/dL   eAG (mmol/L) 6.5 mmol/L  Lipid panel   Collection Time: 05/22/23 11:03 AM  Result Value Ref Range   Cholesterol 197 <200 mg/dL   HDL 46 (L) > OR = 50 mg/dL   Triglycerides 130 <865 mg/dL   LDL Cholesterol (Calc) 125 (H) mg/dL (calc)   Total CHOL/HDL Ratio 4.3 <5.0 (calc)   Non-HDL Cholesterol (Calc) 151 (H) <130 mg/dL (calc)  TSH   Collection Time: 05/22/23 11:03 AM  Result Value Ref Range   TSH 0.79 0.40 - 4.50 mIU/L      Assessment & Plan:   Problem List Items Addressed This Visit   None Visit Diagnoses       Chronic bilateral low back pain without sciatica    -  Primary   Relevant Medications   baclofen (LIORESAL) 10 MG tablet   Other Relevant Orders   DG Lumbar Spine Complete     Muscle spasm of back       Relevant Medications   baclofen (LIORESAL) 10 MG tablet       Acute on Chronic Low back pain with muscle spasms Intermittent low back pain with stiffness and muscle spasms, likely due to arthritis. No significant radiating pain. Tylenol provides some relief.  No X-rays available.  - Order Lumbar Spine X-ray, we do not have X-ray tech today, return Weds 6/11 - Prescribe baclofen, 60 tablets with refills, to be taken two to three times a day as needed. - Advise Tylenol 1000 mg three times a day for pain management. - Recommend ibuprofen or Advil if Tylenol is insufficient, cautioning against excessive use. - Discuss gabapentin for future nighttime nerve pain management. - If not improving, may schedule follow-up after x-ray results are available. May arrange  Ortho vs PT if not improved. - Instruct to call if stronger medication is needed or if symptoms persist.        Orders Placed This Encounter  Procedures   DG Lumbar Spine Complete    Standing Status:   Future    Expiration Date:   08/26/2024    Reason for Exam (SYMPTOM  OR DIAGNOSIS REQUIRED):   chronic low back pain    Preferred imaging location?:   [facility]    Meds ordered this encounter  Medications   baclofen (LIORESAL) 10 MG tablet    Sig: Take 0.5-1 tablets (5-10 mg total) by mouth 3 (three) times daily as needed for muscle spasms.    Dispense:  60 each    Refill:  2    Follow up plan: Return if symptoms worsen or fail to improve.  Domingo Friend, DO [facility] 08/27/2023, 11:17 AM

## 2023-09-15 ENCOUNTER — Ambulatory Visit (INDEPENDENT_AMBULATORY_CARE_PROVIDER_SITE_OTHER): Admitting: Internal Medicine

## 2023-09-15 ENCOUNTER — Encounter: Payer: Self-pay | Admitting: Internal Medicine

## 2023-09-15 VITALS — BP 128/78 | HR 83 | Ht 60.0 in | Wt 169.6 lb

## 2023-09-15 DIAGNOSIS — J301 Allergic rhinitis due to pollen: Secondary | ICD-10-CM | POA: Diagnosis not present

## 2023-09-15 DIAGNOSIS — R051 Acute cough: Secondary | ICD-10-CM

## 2023-09-15 MED ORDER — PROMETHAZINE-DM 6.25-15 MG/5ML PO SYRP: 5.0000 mL | ORAL_SOLUTION | Freq: Every evening | ORAL | 0 refills | Status: AC | PRN

## 2023-09-15 MED ORDER — FLUTICASONE PROPIONATE 50 MCG/ACT NA SUSP: 2.0000 | Freq: Every day | NASAL | 0 refills | Status: AC

## 2023-09-15 MED ORDER — CETIRIZINE HCL 10 MG PO TABS
10.0000 mg | ORAL_TABLET | Freq: Every day | ORAL | 0 refills | Status: AC
Start: 2023-09-15 — End: ?

## 2023-09-15 NOTE — Progress Notes (Signed)
 Subjective:    Patient ID: Brittney Schmidt, female    DOB: [birth year], 73 y.o.   MRN: [id number]  HPI  Discussed the use of AI scribe software for clinical note transcription with the patient, who gave verbal consent to proceed.  Brittney Schmidt is a 73 year old female who presents with nasal congestion and cough for three days.  She has been experiencing nasal congestion and a cough for the past three days. The nasal discharge is clear, and the cough is more pronounced at night, producing a liquid similar to saliva when she spits. No runny nose or ear pain, but she has a slight headache and an itchy throat, which leads to persistent coughing. No fever, chills, or body aches, but she feels slightly nauseous without vomiting or diarrhea.  She mentions that someone around her had a cold recently, which might have contributed to her symptoms. She has seasonal allergies around this time of year. No history of asthma or COPD.   Review of Systems   Past Medical History:  Diagnosis Date   Ankle swelling    Hypertension    Wears dentures    full upper, partial lower (doesn't wear lower)    Current Outpatient Medications  Medication Sig Dispense Refill   aspirin 81 MG tablet Take 81 mg by mouth daily.     baclofen  (LIORESAL ) 10 MG tablet Take 0.5-1 tablets (5-10 mg total) by mouth 3 (three) times daily as needed for muscle spasms. 60 each 2   fluticasone  (FLONASE ) 50 MCG/ACT nasal spray Place 2 sprays into both nostrils daily. Use for 4-6 weeks then stop and use seasonally or as needed. 16 g 3   lisinopril -hydrochlorothiazide  (ZESTORETIC ) 20-25 MG tablet Take 1 tablet by mouth daily. 90 tablet 3   Rimegepant Sulfate (NURTEC) 75 MG TBDP Take 1 tablet (75 mg total) by mouth as directed. (Patient not taking: Reported on 08/27/2023) 2 tablet 0   rizatriptan  (MAXALT -MLT) 10 MG disintegrating tablet Take 1 tablet (10 mg total) by mouth as needed for migraine. May repeat in 2 hours if needed  (Patient not taking: Reported on 08/27/2023) 10 tablet 2   No current facility-administered medications for this visit.    No Known Allergies  Family History  Problem Relation Age of Onset   Diabetes Mother    Hypertension Mother    Colon polyps Father    Heart disease Father    Heart attack Father    Cancer Brother    Hypertension Son    Breast cancer Neg Hx    Colon cancer Neg Hx     Social History   Socioeconomic History   Marital status: Single    Spouse name: Not on file   Number of children: 3   Years of education: GED   Highest education level: Not on file  Occupational History   Occupation: unemployed (previously worked as temp)  Tobacco Use   Smoking status: Never   Smokeless tobacco: Never  Vaping Use   Vaping status: Never Used  Substance and Sexual Activity   Alcohol use: Yes    Alcohol/week: 2.0 standard drinks of alcohol    Types: 2 Cans of beer per week    Comment: occasional, planning to quit completely   Drug use: No   Sexual activity: Not Currently    Partners: Male  Other Topics Concern   Not on file  Social History Narrative   Has family that lives close by   Social Drivers of  Health   Financial Resource Strain: Low Risk  (08/21/2023)   Overall Financial Resource Strain (CARDIA)    Difficulty of Paying Living Expenses: Not hard at all  Food Insecurity: No Food Insecurity (08/21/2023)   Hunger Vital Sign    Worried About Running Out of Food in the Last Year: Never true    Ran Out of Food in the Last Year: Never true  Transportation Needs: No Transportation Needs (08/21/2023)   PRAPARE - Administrator, Civil Service (Medical): No    Lack of Transportation (Non-Medical): No  Physical Activity: Sufficiently Active (08/21/2023)   Exercise Vital Sign    Days of Exercise per Week: 7 days    Minutes of Exercise per Session: 30 min  Stress: No Stress Concern Present (08/21/2023)   [employer] of Occupational Health - Occupational  Stress Questionnaire    Feeling of Stress : Not at all  Social Connections: Moderately Isolated (08/21/2023)   Social Connection and Isolation Panel    Frequency of Communication with Friends and Family: More than three times a week    Frequency of Social Gatherings with Friends and Family: More than three times a week    Attends Religious Services: More than 4 times per year    Active Member of [employer] or Organizations: No    Attends Banker Meetings: Never    Marital Status: Never married  Intimate Partner Violence: Not At Risk (08/21/2023)   Humiliation, Afraid, Rape, and Kick questionnaire    Fear of Current or Ex-Partner: No    Emotionally Abused: No    Physically Abused: No    Sexually Abused: No     Constitutional: Pt reports headache. Denies fever, malaise, fatigue, or abrupt weight changes.  HEENT: Patient reports nasal congestion and sore throat.  Denies eye pain, eye redness, ear pain, ringing in the ears, wax buildup, runny nose, nasal congestion, bloody nose. Respiratory: Patient reports cough.  Denies difficulty breathing, shortness of breath, or sputum production.   Cardiovascular: Denies chest pain, chest tightness, palpitations or swelling in the hands or feet.  Gastrointestinal: Pt reports nausea. Denies abdominal pain, bloating, constipation, diarrhea or blood in the stool.  Musculoskeletal: Denies decrease in range of motion, difficulty with gait, muscle pain or joint pain and swelling.  Neurological: Denies dizziness, difficulty with memory, difficulty with speech or problems with balance and coordination.   No other specific complaints in a complete review of systems (except as listed in HPI above).      Objective:   Physical Exam  BP 128/78 (BP Location: Left Arm, Patient Position: Sitting, Cuff Size: Normal)   Pulse 83   Ht 5' (1.524 m)   Wt 169 lb 9.6 oz (76.9 kg)   SpO2 98%   BMI 33.12 kg/m   Wt Readings from Last 3 Encounters:  08/27/23  171 lb (77.6 kg)  05/21/23 171 lb (77.6 kg)  08/15/22 174 lb (78.9 kg)    General: Appears her  stated age, obese, in NAD. Skin: Warm, dry and intact. No rashes noted. HEENT: Head: normal shape and size, no sinus tenderness noted; Eyes: sclera white, no icterus, conjunctiva pink, PERRLA and EOMs intact; Nose: mucosa boggy and moist, turbinates swollen; Throat/Mouth: Teeth present, mucosa pink and moist, + PND, no exudate, lesions or ulcerations noted.  Neck:  No adenopathy noted. Cardiovascular: Normal rate and rhythm. S1,S2 noted.  No murmur, rubs or gallops noted.  Pulmonary/Chest: Normal effort and positive vesicular breath sounds. No respiratory distress.  No wheezes, rales or ronchi noted.  Musculoskeletal: No difficulty with gait.  Neurological: Alert and oriented.   BMET    Component Value Date/Time   NA 141 05/22/2023 1103   NA 140 05/11/2012 1459   K 3.8 05/22/2023 1103   K 3.4 (L) 05/11/2012 1459   CL 103 05/22/2023 1103   CL 105 05/11/2012 1459   CO2 31 05/22/2023 1103   CO2 30 05/11/2012 1459   GLUCOSE 77 05/22/2023 1103   GLUCOSE 80 05/11/2012 1459   BUN 12 05/22/2023 1103   BUN 13 05/11/2012 1459   CREATININE 0.75 05/22/2023 1103   CALCIUM 9.4 05/22/2023 1103   CALCIUM 9.1 05/11/2012 1459   GFRNONAA >60 08/20/2021 1204   GFRNONAA >60 05/11/2012 1459   GFRAA >60 04/19/2018 1124   GFRAA >60 05/11/2012 1459    Lipid Panel     Component Value Date/Time   CHOL 197 05/22/2023 1103   TRIG 138 05/22/2023 1103   HDL 46 (L) 05/22/2023 1103   CHOLHDL 4.3 05/22/2023 1103   LDLCALC 125 (H) 05/22/2023 1103    CBC    Component Value Date/Time   WBC 7.1 05/22/2023 1103   RBC 4.61 05/22/2023 1103   HGB 12.7 05/22/2023 1103   HGB 13.1 05/11/2012 1459   HCT 39.9 05/22/2023 1103   HCT 40.1 05/11/2012 1459   PLT 208 05/22/2023 1103   PLT 216 05/11/2012 1459   MCV 86.6 05/22/2023 1103   MCV 83 05/11/2012 1459   MCH 27.5 05/22/2023 1103   MCHC 31.8 (L) 05/22/2023  1103   RDW 12.6 05/22/2023 1103   RDW 13.3 05/11/2012 1459   LYMPHSABS 2,511 07/22/2022 1101   EOSABS 78 05/22/2023 1103   BASOSABS 28 05/22/2023 1103    Hgb A1C Lab Results  Component Value Date   HGBA1C 5.7 (H) 05/22/2023            Assessment & Plan:  Assessment and Plan    Allergic Rhinitis Nasal congestion, cough, and throat itching due to seasonal allergies. Clear nasal discharge and postnasal drip are present. - Prescribed cetirizine 10 mg once daily at bedtime for 10 days. - Prescribed fluticasone  nasal spray, two squirts in each nostril every morning for one week.  Cough Persistent cough, especially at night, related to postnasal drip from allergic rhinitis. Causes sleep disturbances and mild nausea. - Prescribed promethazine DM syrup at bedtime to alleviate cough and assist with sleep, cautioning for potential drowsiness.       Follow-up with your PCP as previously scheduled Angeline Laura, NP

## 2023-09-15 NOTE — Patient Instructions (Signed)
 Allergic Rhinitis, Adult  Allergic rhinitis is a reaction to allergens. Allergens are things that can cause an allergic reaction. This condition affects the lining inside the nose (mucous membrane). There are two types of allergic rhinitis: Seasonal. This type is also called hay fever. It happens only during some times of the year. Perennial. This type can happen at any time of the year. This condition cannot be spread from person to person (is not contagious). It can be mild, bad, or very bad. It can develop at any age and may be outgrown. What are the causes? Pollen from grasses, trees, and weeds. Other causes can be: Dust mites. Smoke. Mold. Car fumes. The pee (urine), spit, or dander of pets. Dander is dead skin cells from a pet. What increases the risk? You are more likely to develop this condition if: You have allergies in your family. You have problems like allergies in your family. You may have: Swelling of parts of your eyes and eyelids. Asthma. This affects how you breathe. Long-term redness and swelling on your skin. Food allergies. What are the signs or symptoms? The main symptom of this condition is a runny or stuffy nose (nasal congestion). Other symptoms may include: Sneezing or coughing. Itching and tearing of your eyes. Mucus that drips down the back of your throat (postnasal drip). This may cause a sore throat. Trouble sleeping. Feeling tired. Headache. How is this treated? There is no cure for this condition. You should avoid things that you are allergic to. Treatment can help to relieve symptoms. This may include: Medicines that block allergy symptoms, such as anti-inflammatories or antihistamines. These may be given as a shot, nasal spray, or pill. Avoiding things you are allergic to. Medicines that give you some of what you are allergic to over time. This is called immunotherapy. It is done if other treatments do not help. You may get: Shots. Medicine under  your tongue. Stronger medicines, if other treatments do not help. Follow these instructions at home: Avoiding allergens Find out what things you are allergic to and avoid them. To do this, try these things: If you get allergies any time of year: Replace carpet with wood, tile, or vinyl flooring. Carpet can trap pet dander and dust. Do not smoke. Do not allow smoking in your home. Change your heating and air conditioning filters at least once a month. If you get allergies only some times of the year: Keep windows closed when you can. Plan things to do outside when pollen counts are lowest. Check pollen counts before you plan things to do outside. When you come indoors, change your clothes and shower before you sit on furniture or bedding. If you are allergic to a pet: Keep the pet out of your bedroom. Vacuum, sweep, and dust often. General instructions Take over-the-counter and prescription medicines only as told by your doctor. Drink enough fluid to keep your pee pale yellow. Where to find more information American Academy of Allergy, Asthma & Immunology: aaaai.org Contact a doctor if: You have a fever. You get a cough that does not go away. You make high-pitched whistling sounds when you breathe, most often when you breathe out (wheeze). Your symptoms slow you down. Your symptoms stop you from doing your normal things each day. Get help right away if: You are short of breath. This symptom may be an emergency. Get help right away. Call 911. Do not wait to see if the symptom will go away. Do not drive yourself to the  hospital. This information is not intended to replace advice given to you by your health care provider. Make sure you discuss any questions you have with your health care provider. Document Revised: 11/18/2021 Document Reviewed: 11/18/2021 Elsevier Patient Education  2024 [employer].

## 2023-10-09 ENCOUNTER — Other Ambulatory Visit: Payer: Self-pay | Admitting: Family Medicine

## 2023-10-09 DIAGNOSIS — I10 Essential (primary) hypertension: Secondary | ICD-10-CM

## 2023-10-12 NOTE — Telephone Encounter (Signed)
 Requested Prescriptions  Pending Prescriptions Disp Refills   lisinopril -hydrochlorothiazide  (ZESTORETIC ) 20-25 MG tablet [Pharmacy Med Name: Lisinopril -hydroCHLOROthiazide  20-25 MG Oral Tablet] 90 tablet 0    Sig: Take 1 tablet by mouth once daily     Cardiovascular:  ACEI + Diuretic Combos Passed - 10/12/2023  3:53 PM      Passed - Na in normal range and within 180 days    Sodium  Date Value Ref Range Status  05/22/2023 141 135 - 146 mmol/L Final  05/11/2012 140 136 - 145 mmol/L Final         Passed - K in normal range and within 180 days    Potassium  Date Value Ref Range Status  05/22/2023 3.8 3.5 - 5.3 mmol/L Final  05/11/2012 3.4 (L) 3.5 - 5.1 mmol/L Final         Passed - Cr in normal range and within 180 days    Creat  Date Value Ref Range Status  05/22/2023 0.75 0.60 - 1.00 mg/dL Final         Passed - eGFR is 30 or above and within 180 days    EGFR (African American)  Date Value Ref Range Status  05/11/2012 >60  Final   GFR calc Af Amer  Date Value Ref Range Status  04/19/2018 >60 >60 mL/min Final   EGFR (Non-African Amer.)  Date Value Ref Range Status  05/11/2012 >60  Final    Comment:    eGFR values <74mL/min/1.73 m2 may be an indication of chronic kidney disease (CKD). Calculated eGFR is useful in patients with stable renal function. The eGFR calculation will not be reliable in acutely ill patients when serum creatinine is changing rapidly. It is not useful in  patients on dialysis. The eGFR calculation may not be applicable to patients at the low and high extremes of body sizes, pregnant women, and vegetarians.    GFR, Estimated  Date Value Ref Range Status  08/20/2021 >60 >60 mL/min Final    Comment:    (NOTE) Calculated using the CKD-EPI Creatinine Equation (2021)    eGFR  Date Value Ref Range Status  05/22/2023 86 > OR = 60 mL/min/1.25m2 Final         Passed - Patient is not pregnant      Passed - Last BP in normal range    BP Readings  from Last 1 Encounters:  09/15/23 128/78         Passed - Valid encounter within last 6 months    Recent Outpatient Visits           3 weeks ago Acute cough   [facility] [place], [place] W, NP   1 month ago Chronic bilateral low back pain without sciatica   [facility] [place], Marsa PARAS, DO   4 months ago Annual physical exam   [facility] [place], Marsa PARAS, [place]

## 2023-12-31 DIAGNOSIS — B351 Tinea unguium: Secondary | ICD-10-CM | POA: Diagnosis not present

## 2023-12-31 DIAGNOSIS — M2042 Other hammer toe(s) (acquired), left foot: Secondary | ICD-10-CM | POA: Diagnosis not present

## 2023-12-31 DIAGNOSIS — L600 Ingrowing nail: Secondary | ICD-10-CM | POA: Diagnosis not present

## 2023-12-31 DIAGNOSIS — M778 Other enthesopathies, not elsewhere classified: Secondary | ICD-10-CM | POA: Diagnosis not present

## 2023-12-31 DIAGNOSIS — M79675 Pain in left toe(s): Secondary | ICD-10-CM | POA: Diagnosis not present

## 2023-12-31 DIAGNOSIS — M79674 Pain in right toe(s): Secondary | ICD-10-CM | POA: Diagnosis not present

## 2023-12-31 DIAGNOSIS — M898X9 Other specified disorders of bone, unspecified site: Secondary | ICD-10-CM | POA: Diagnosis not present

## 2024-01-04 ENCOUNTER — Encounter: Payer: Self-pay | Admitting: Family Medicine

## 2024-01-04 ENCOUNTER — Other Ambulatory Visit: Payer: Self-pay | Admitting: Family Medicine

## 2024-01-04 DIAGNOSIS — Z1231 Encounter for screening mammogram for malignant neoplasm of breast: Secondary | ICD-10-CM

## 2024-01-15 ENCOUNTER — Telehealth: Payer: Self-pay | Admitting: Family Medicine

## 2024-01-15 ENCOUNTER — Other Ambulatory Visit: Payer: Self-pay

## 2024-01-15 DIAGNOSIS — I10 Essential (primary) hypertension: Secondary | ICD-10-CM

## 2024-01-15 MED ORDER — LISINOPRIL-HYDROCHLOROTHIAZIDE 20-25 MG PO TABS: 1.0000 | ORAL_TABLET | Freq: Every day | ORAL | 0 refills | Status: DC

## 2024-01-15 NOTE — Telephone Encounter (Signed)
 Medication refilled

## 2024-01-15 NOTE — Telephone Encounter (Signed)
 Prescription Request  01/15/2024  LOV: 08/27/2023  What is the name of the medication or equipment? lisinopril -hydrochlorothiazide  (ZESTORETIC ) 20-25 MG tablet   Have you contacted your pharmacy to request a refill? No   Which pharmacy would you like this sent to?  [facility] [address] ([address]), [place] - [address] [place]) [place] [address] Phone: [phone] Fax: [phone]    Patient notified that their request is being sent to the clinical staff for review and that they should receive a response within 2 business days.   Please advise at [facility] [phone]

## 2024-01-19 ENCOUNTER — Ambulatory Visit (INDEPENDENT_AMBULATORY_CARE_PROVIDER_SITE_OTHER)

## 2024-01-19 DIAGNOSIS — Z23 Encounter for immunization: Secondary | ICD-10-CM

## 2024-03-08 ENCOUNTER — Ambulatory Visit
Admission: RE | Admit: 2024-03-08 | Discharge: 2024-03-08 | Disposition: A | Source: Ambulatory Visit | Attending: Family Medicine | Admitting: Family Medicine

## 2024-03-08 DIAGNOSIS — Z780 Asymptomatic menopausal state: Secondary | ICD-10-CM

## 2024-03-08 DIAGNOSIS — Z1231 Encounter for screening mammogram for malignant neoplasm of breast: Secondary | ICD-10-CM | POA: Insufficient documentation

## 2024-03-10 DIAGNOSIS — M810 Age-related osteoporosis without current pathological fracture: Secondary | ICD-10-CM | POA: Insufficient documentation

## 2024-03-14 ENCOUNTER — Ambulatory Visit: Payer: Self-pay | Admitting: Family Medicine

## 2024-04-26 ENCOUNTER — Other Ambulatory Visit: Payer: Self-pay | Admitting: Family Medicine

## 2024-04-26 DIAGNOSIS — I10 Essential (primary) hypertension: Secondary | ICD-10-CM

## 2024-04-26 NOTE — Telephone Encounter (Unsigned)
 Copied from CRM [id number]. Topic: Clinical - Medication Refill >> Apr 26, 2024 11:21 AM Travis F wrote: Medication: lisinopril -hydrochlorothiazide  (ZESTORETIC ) 20-25 MG tablet [[id number]]  Has the patient contacted their pharmacy? Yes  (Agent: If yes, when and what did the pharmacy advise?) contact office for refill   This is the patient's preferred pharmacy:  [facility] [address] [place] - [address] [place]E OTHEL JACOBS [place]) [place] [address] Phone: [phone] Fax: [phone]  Is this the correct pharmacy for this prescription? Yes If no, delete pharmacy and type the correct one.   Has the prescription been filled recently? Yes  Is the patient out of the medication? No  Has the patient been seen for an appointment in the last year OR does the patient have an upcoming appointment? Yes  Can we respond through MyChart? No  Agent: Please be advised that Rx refills may take up to 3 business days. We ask that you follow-up with your pharmacy.

## 2024-04-27 MED ORDER — LISINOPRIL-HYDROCHLOROTHIAZIDE 20-25 MG PO TABS: 1.0000 | ORAL_TABLET | Freq: Every day | ORAL | 0 refills | Status: AC

## 2024-04-27 NOTE — Telephone Encounter (Signed)
 Requested Prescriptions  Pending Prescriptions Disp Refills   lisinopril -hydrochlorothiazide  (ZESTORETIC ) 20-25 MG tablet 90 tablet 0    Sig: Take 1 tablet by mouth daily.     Cardiovascular:  ACEI + Diuretic Combos Failed - 04/27/2024  4:26 PM      Failed - Na in normal range and within 180 days    Sodium  Date Value Ref Range Status  05/22/2023 141 135 - 146 mmol/L Final  05/11/2012 140 136 - 145 mmol/L Final         Failed - K in normal range and within 180 days    Potassium  Date Value Ref Range Status  05/22/2023 3.8 3.5 - 5.3 mmol/L Final  05/11/2012 3.4 (L) 3.5 - 5.1 mmol/L Final         Failed - Cr in normal range and within 180 days    Creat  Date Value Ref Range Status  05/22/2023 0.75 0.60 - 1.00 mg/dL Final         Failed - eGFR is 30 or above and within 180 days    EGFR (African American)  Date Value Ref Range Status  05/11/2012 >60  Final   GFR calc Af Amer  Date Value Ref Range Status  04/19/2018 >60 >60 mL/min Final   EGFR (Non-African Amer.)  Date Value Ref Range Status  05/11/2012 >60  Final    Comment:    eGFR values <69mL/min/1.73 m2 may be an indication of chronic kidney disease (CKD). Calculated eGFR is useful in patients with stable renal function. The eGFR calculation will not be reliable in acutely ill patients when serum creatinine is changing rapidly. It is not useful in  patients on dialysis. The eGFR calculation may not be applicable to patients at the low and high extremes of body sizes, pregnant women, and vegetarians.    GFR, Estimated  Date Value Ref Range Status  08/20/2021 >60 >60 mL/min Final    Comment:    (NOTE) Calculated using the CKD-EPI Creatinine Equation (2021)    eGFR  Date Value Ref Range Status  05/22/2023 86 > OR = 60 mL/min/1.21m2 Final         Failed - Valid encounter within last 6 months    Recent Outpatient Visits           7 months ago Acute cough   [place] [facility] [place],  [place] W, NP   8 months ago Chronic bilateral low back pain without sciatica   [place] [facility] [place] Marsa PARAS, DO   11 months ago Annual physical exam   [place] [facility] [place] Marsa [place], [place]              Passed - Patient is not pregnant      Passed - Last BP in normal range    BP Readings from Last 1 Encounters:  09/15/23 128/78

## 2024-04-28 NOTE — Telephone Encounter (Signed)
 Duplicate request, refilled 04/27/24.  Requested Prescriptions  Pending Prescriptions Disp Refills   lisinopril -hydrochlorothiazide  (ZESTORETIC ) 20-25 MG tablet [Pharmacy Med Name: Lisinopril -hydroCHLOROthiazide  20-25 MG Oral Tablet] 90 tablet 0    Sig: Take 1 tablet by mouth once daily     Cardiovascular:  ACEI + Diuretic Combos Failed - 04/28/2024 11:39 AM      Failed - Na in normal range and within 180 days    Sodium  Date Value Ref Range Status  05/22/2023 141 135 - 146 mmol/L Final  05/11/2012 140 136 - 145 mmol/L Final         Failed - K in normal range and within 180 days    Potassium  Date Value Ref Range Status  05/22/2023 3.8 3.5 - 5.3 mmol/L Final  05/11/2012 3.4 (L) 3.5 - 5.1 mmol/L Final         Failed - Cr in normal range and within 180 days    Creat  Date Value Ref Range Status  05/22/2023 0.75 0.60 - 1.00 mg/dL Final         Failed - eGFR is 30 or above and within 180 days    EGFR (African American)  Date Value Ref Range Status  05/11/2012 >60  Final   GFR calc Af Amer  Date Value Ref Range Status  04/19/2018 >60 >60 mL/min Final   EGFR (Non-African Amer.)  Date Value Ref Range Status  05/11/2012 >60  Final    Comment:    eGFR values <30mL/min/1.73 m2 may be an indication of chronic kidney disease (CKD). Calculated eGFR is useful in patients with stable renal function. The eGFR calculation will not be reliable in acutely ill patients when serum creatinine is changing rapidly. It is not useful in  patients on dialysis. The eGFR calculation may not be applicable to patients at the low and high extremes of body sizes, pregnant women, and vegetarians.    GFR, Estimated  Date Value Ref Range Status  08/20/2021 >60 >60 mL/min Final    Comment:    (NOTE) Calculated using the CKD-EPI Creatinine Equation (2021)    eGFR  Date Value Ref Range Status  05/22/2023 86 > OR = 60 mL/min/1.23m2 Final         Failed - Valid encounter within last 6 months     Recent Outpatient Visits           7 months ago Acute cough   [place] [facility] [place], [place] W, NP   8 months ago Chronic bilateral low back pain without sciatica   [place] [facility] [place] Marsa PARAS, DO   11 months ago Annual physical exam   [place] [facility] [place] Marsa [place], [place]              Passed - Patient is not pregnant      Passed - Last BP in normal range    BP Readings from Last 1 Encounters:  09/15/23 128/78

## 2024-05-17 ENCOUNTER — Encounter: Admitting: Family Medicine

## 2024-08-26 ENCOUNTER — Ambulatory Visit

## 2024-08-31 ENCOUNTER — Ambulatory Visit
